# Patient Record
Sex: Female | Born: 1975 | Race: Black or African American | Hispanic: No | Marital: Single | Smoking: Never smoker
Health system: Southern US, Community
[De-identification: ages and names within clinical notes are randomized; demographics above are authoritative.]

## PROBLEM LIST (undated history)

## (undated) DIAGNOSIS — F419 Anxiety disorder, unspecified: Secondary | ICD-10-CM

## (undated) DIAGNOSIS — Z803 Family history of malignant neoplasm of breast: Secondary | ICD-10-CM

## (undated) DIAGNOSIS — G43109 Migraine with aura, not intractable, without status migrainosus: Secondary | ICD-10-CM

## (undated) DIAGNOSIS — F32A Depression, unspecified: Secondary | ICD-10-CM

## (undated) DIAGNOSIS — Z800 Family history of malignant neoplasm of digestive organs: Secondary | ICD-10-CM

## (undated) DIAGNOSIS — E669 Obesity, unspecified: Secondary | ICD-10-CM

## (undated) DIAGNOSIS — F329 Major depressive disorder, single episode, unspecified: Secondary | ICD-10-CM

## (undated) DIAGNOSIS — D649 Anemia, unspecified: Secondary | ICD-10-CM

## (undated) DIAGNOSIS — R51 Headache: Secondary | ICD-10-CM

## (undated) HISTORY — DX: Family history of malignant neoplasm of breast: Z80.3

## (undated) HISTORY — DX: Obesity, unspecified: E66.9

## (undated) HISTORY — DX: Family history of malignant neoplasm of digestive organs: Z80.0

## (undated) HISTORY — PX: WISDOM TOOTH EXTRACTION: SHX21

## (undated) HISTORY — PX: CHOLECYSTECTOMY: SHX55

## (undated) HISTORY — PX: DILATION AND CURETTAGE OF UTERUS: SHX78

---

## 1997-11-12 ENCOUNTER — Other Ambulatory Visit: Admission: RE | Admit: 1997-11-12 | Discharge: 1997-11-12 | Payer: Self-pay | Admitting: Obstetrics

## 1997-11-12 ENCOUNTER — Ambulatory Visit (HOSPITAL_COMMUNITY): Admission: RE | Admit: 1997-11-12 | Discharge: 1997-11-12 | Payer: Self-pay | Admitting: Obstetrics

## 1998-01-04 ENCOUNTER — Ambulatory Visit (HOSPITAL_COMMUNITY): Admission: RE | Admit: 1998-01-04 | Discharge: 1998-01-04 | Payer: Self-pay | Admitting: Obstetrics

## 1998-01-31 ENCOUNTER — Inpatient Hospital Stay (HOSPITAL_COMMUNITY): Admission: AD | Admit: 1998-01-31 | Discharge: 1998-01-31 | Payer: Self-pay | Admitting: *Deleted

## 1998-02-26 ENCOUNTER — Inpatient Hospital Stay (HOSPITAL_COMMUNITY): Admission: AD | Admit: 1998-02-26 | Discharge: 1998-02-26 | Payer: Self-pay | Admitting: Obstetrics

## 1998-02-27 ENCOUNTER — Ambulatory Visit (HOSPITAL_COMMUNITY): Admission: RE | Admit: 1998-02-27 | Discharge: 1998-02-27 | Payer: Self-pay | Admitting: Obstetrics

## 1998-03-06 ENCOUNTER — Inpatient Hospital Stay (HOSPITAL_COMMUNITY): Admission: AD | Admit: 1998-03-06 | Discharge: 1998-03-06 | Payer: Self-pay | Admitting: Obstetrics

## 1998-03-18 ENCOUNTER — Inpatient Hospital Stay (HOSPITAL_COMMUNITY): Admission: AD | Admit: 1998-03-18 | Discharge: 1998-03-18 | Payer: Self-pay | Admitting: Obstetrics

## 1998-03-28 ENCOUNTER — Emergency Department (HOSPITAL_COMMUNITY): Admission: EM | Admit: 1998-03-28 | Discharge: 1998-03-28 | Payer: Self-pay | Admitting: Emergency Medicine

## 1998-04-04 ENCOUNTER — Inpatient Hospital Stay (HOSPITAL_COMMUNITY): Admission: AD | Admit: 1998-04-04 | Discharge: 1998-04-06 | Payer: Self-pay | Admitting: Obstetrics

## 1998-06-05 ENCOUNTER — Inpatient Hospital Stay (HOSPITAL_COMMUNITY): Admission: EM | Admit: 1998-06-05 | Discharge: 1998-06-06 | Payer: Self-pay | Admitting: Emergency Medicine

## 1998-06-05 ENCOUNTER — Encounter: Payer: Self-pay | Admitting: Emergency Medicine

## 1998-06-29 ENCOUNTER — Encounter: Payer: Self-pay | Admitting: Emergency Medicine

## 1998-06-29 ENCOUNTER — Emergency Department (HOSPITAL_COMMUNITY): Admission: EM | Admit: 1998-06-29 | Discharge: 1998-06-29 | Payer: Self-pay | Admitting: Emergency Medicine

## 1998-06-30 ENCOUNTER — Emergency Department (HOSPITAL_COMMUNITY): Admission: EM | Admit: 1998-06-30 | Discharge: 1998-06-30 | Payer: Self-pay | Admitting: Emergency Medicine

## 1998-09-02 ENCOUNTER — Emergency Department (HOSPITAL_COMMUNITY): Admission: EM | Admit: 1998-09-02 | Discharge: 1998-09-02 | Payer: Self-pay | Admitting: Emergency Medicine

## 1998-10-21 ENCOUNTER — Emergency Department (HOSPITAL_COMMUNITY): Admission: EM | Admit: 1998-10-21 | Discharge: 1998-10-21 | Payer: Self-pay | Admitting: Emergency Medicine

## 1999-01-03 ENCOUNTER — Emergency Department (HOSPITAL_COMMUNITY): Admission: EM | Admit: 1999-01-03 | Discharge: 1999-01-03 | Payer: Self-pay | Admitting: Emergency Medicine

## 1999-03-20 ENCOUNTER — Encounter: Payer: Self-pay | Admitting: Emergency Medicine

## 1999-03-20 ENCOUNTER — Emergency Department (HOSPITAL_COMMUNITY): Admission: EM | Admit: 1999-03-20 | Discharge: 1999-03-20 | Payer: Self-pay | Admitting: Emergency Medicine

## 1999-05-13 ENCOUNTER — Other Ambulatory Visit: Admission: RE | Admit: 1999-05-13 | Discharge: 1999-05-13 | Payer: Self-pay | Admitting: Obstetrics

## 1999-05-19 ENCOUNTER — Emergency Department (HOSPITAL_COMMUNITY): Admission: EM | Admit: 1999-05-19 | Discharge: 1999-05-19 | Payer: Self-pay | Admitting: Emergency Medicine

## 1999-05-20 ENCOUNTER — Encounter: Payer: Self-pay | Admitting: Emergency Medicine

## 1999-09-05 ENCOUNTER — Ambulatory Visit (HOSPITAL_COMMUNITY): Admission: RE | Admit: 1999-09-05 | Discharge: 1999-09-05 | Payer: Self-pay | Admitting: Obstetrics

## 1999-09-05 ENCOUNTER — Encounter: Payer: Self-pay | Admitting: Obstetrics

## 1999-09-18 ENCOUNTER — Encounter: Payer: Self-pay | Admitting: Obstetrics

## 1999-09-18 ENCOUNTER — Encounter: Admission: RE | Admit: 1999-09-18 | Discharge: 1999-09-18 | Payer: Self-pay | Admitting: Obstetrics

## 1999-11-18 ENCOUNTER — Encounter: Payer: Self-pay | Admitting: Obstetrics

## 1999-11-18 ENCOUNTER — Encounter: Admission: RE | Admit: 1999-11-18 | Discharge: 1999-11-18 | Payer: Self-pay | Admitting: Obstetrics

## 2000-08-26 ENCOUNTER — Inpatient Hospital Stay (HOSPITAL_COMMUNITY): Admission: AD | Admit: 2000-08-26 | Discharge: 2000-08-26 | Payer: Self-pay | Admitting: Obstetrics

## 2000-08-30 ENCOUNTER — Emergency Department (HOSPITAL_COMMUNITY): Admission: EM | Admit: 2000-08-30 | Discharge: 2000-08-30 | Payer: Self-pay | Admitting: Emergency Medicine

## 2001-05-14 ENCOUNTER — Encounter: Payer: Self-pay | Admitting: Emergency Medicine

## 2001-05-14 ENCOUNTER — Emergency Department (HOSPITAL_COMMUNITY): Admission: EM | Admit: 2001-05-14 | Discharge: 2001-05-15 | Payer: Self-pay | Admitting: Emergency Medicine

## 2001-05-17 ENCOUNTER — Encounter: Payer: Self-pay | Admitting: Emergency Medicine

## 2001-05-17 ENCOUNTER — Emergency Department (HOSPITAL_COMMUNITY): Admission: EM | Admit: 2001-05-17 | Discharge: 2001-05-17 | Payer: Self-pay | Admitting: Emergency Medicine

## 2001-09-18 ENCOUNTER — Emergency Department (HOSPITAL_COMMUNITY): Admission: EM | Admit: 2001-09-18 | Discharge: 2001-09-19 | Payer: Self-pay | Admitting: Emergency Medicine

## 2002-01-14 ENCOUNTER — Inpatient Hospital Stay (HOSPITAL_COMMUNITY): Admission: AD | Admit: 2002-01-14 | Discharge: 2002-01-14 | Payer: Self-pay | Admitting: Obstetrics

## 2002-03-24 ENCOUNTER — Emergency Department (HOSPITAL_COMMUNITY): Admission: EM | Admit: 2002-03-24 | Discharge: 2002-03-24 | Payer: Self-pay | Admitting: Emergency Medicine

## 2002-05-09 ENCOUNTER — Emergency Department (HOSPITAL_COMMUNITY): Admission: EM | Admit: 2002-05-09 | Discharge: 2002-05-09 | Payer: Self-pay | Admitting: Emergency Medicine

## 2002-05-09 ENCOUNTER — Encounter: Payer: Self-pay | Admitting: Obstetrics

## 2002-05-12 ENCOUNTER — Encounter (INDEPENDENT_AMBULATORY_CARE_PROVIDER_SITE_OTHER): Payer: Self-pay

## 2002-05-12 ENCOUNTER — Ambulatory Visit (HOSPITAL_COMMUNITY): Admission: RE | Admit: 2002-05-12 | Discharge: 2002-05-12 | Payer: Self-pay | Admitting: Obstetrics

## 2002-08-08 ENCOUNTER — Observation Stay (HOSPITAL_COMMUNITY): Admission: AD | Admit: 2002-08-08 | Discharge: 2002-08-09 | Payer: Self-pay | Admitting: Obstetrics

## 2002-08-08 ENCOUNTER — Encounter: Payer: Self-pay | Admitting: Obstetrics

## 2002-10-22 ENCOUNTER — Inpatient Hospital Stay (HOSPITAL_COMMUNITY): Admission: AD | Admit: 2002-10-22 | Discharge: 2002-10-22 | Payer: Self-pay | Admitting: Obstetrics

## 2003-02-06 ENCOUNTER — Inpatient Hospital Stay (HOSPITAL_COMMUNITY): Admission: AD | Admit: 2003-02-06 | Discharge: 2003-02-06 | Payer: Self-pay | Admitting: Obstetrics

## 2003-03-21 ENCOUNTER — Inpatient Hospital Stay (HOSPITAL_COMMUNITY): Admission: AD | Admit: 2003-03-21 | Discharge: 2003-03-21 | Payer: Self-pay | Admitting: Obstetrics

## 2003-03-22 ENCOUNTER — Inpatient Hospital Stay (HOSPITAL_COMMUNITY): Admission: AD | Admit: 2003-03-22 | Discharge: 2003-03-22 | Payer: Self-pay | Admitting: Obstetrics

## 2003-04-02 ENCOUNTER — Inpatient Hospital Stay (HOSPITAL_COMMUNITY): Admission: AD | Admit: 2003-04-02 | Discharge: 2003-04-05 | Payer: Self-pay | Admitting: Obstetrics

## 2003-04-10 ENCOUNTER — Emergency Department (HOSPITAL_COMMUNITY): Admission: EM | Admit: 2003-04-10 | Discharge: 2003-04-10 | Payer: Self-pay | Admitting: Emergency Medicine

## 2003-07-14 ENCOUNTER — Emergency Department (HOSPITAL_COMMUNITY): Admission: EM | Admit: 2003-07-14 | Discharge: 2003-07-14 | Payer: Self-pay | Admitting: Emergency Medicine

## 2003-11-12 ENCOUNTER — Emergency Department (HOSPITAL_COMMUNITY): Admission: EM | Admit: 2003-11-12 | Discharge: 2003-11-13 | Payer: Self-pay | Admitting: Emergency Medicine

## 2004-03-20 ENCOUNTER — Emergency Department (HOSPITAL_COMMUNITY): Admission: EM | Admit: 2004-03-20 | Discharge: 2004-03-20 | Payer: Self-pay | Admitting: Emergency Medicine

## 2004-07-08 ENCOUNTER — Ambulatory Visit (HOSPITAL_COMMUNITY): Admission: RE | Admit: 2004-07-08 | Discharge: 2004-07-08 | Payer: Self-pay | Admitting: Internal Medicine

## 2004-07-08 ENCOUNTER — Encounter (INDEPENDENT_AMBULATORY_CARE_PROVIDER_SITE_OTHER): Payer: Self-pay | Admitting: Cardiology

## 2004-11-12 ENCOUNTER — Emergency Department (HOSPITAL_COMMUNITY): Admission: EM | Admit: 2004-11-12 | Discharge: 2004-11-12 | Payer: Self-pay | Admitting: Emergency Medicine

## 2005-03-20 ENCOUNTER — Emergency Department (HOSPITAL_COMMUNITY): Admission: EM | Admit: 2005-03-20 | Discharge: 2005-03-20 | Payer: Self-pay | Admitting: Emergency Medicine

## 2005-07-31 ENCOUNTER — Emergency Department (HOSPITAL_COMMUNITY): Admission: EM | Admit: 2005-07-31 | Discharge: 2005-07-31 | Payer: Self-pay | Admitting: Emergency Medicine

## 2006-03-22 ENCOUNTER — Emergency Department (HOSPITAL_COMMUNITY): Admission: EM | Admit: 2006-03-22 | Discharge: 2006-03-22 | Payer: Self-pay | Admitting: Emergency Medicine

## 2006-06-11 ENCOUNTER — Emergency Department (HOSPITAL_COMMUNITY): Admission: EM | Admit: 2006-06-11 | Discharge: 2006-06-11 | Payer: Self-pay | Admitting: Emergency Medicine

## 2007-02-03 ENCOUNTER — Emergency Department (HOSPITAL_COMMUNITY): Admission: EM | Admit: 2007-02-03 | Discharge: 2007-02-03 | Payer: Self-pay | Admitting: Emergency Medicine

## 2007-06-19 ENCOUNTER — Emergency Department (HOSPITAL_COMMUNITY): Admission: EM | Admit: 2007-06-19 | Discharge: 2007-06-19 | Payer: Self-pay | Admitting: Family Medicine

## 2007-07-05 ENCOUNTER — Inpatient Hospital Stay (HOSPITAL_COMMUNITY): Admission: AD | Admit: 2007-07-05 | Discharge: 2007-07-05 | Payer: Self-pay | Admitting: Obstetrics & Gynecology

## 2007-07-28 ENCOUNTER — Emergency Department (HOSPITAL_COMMUNITY): Admission: EM | Admit: 2007-07-28 | Discharge: 2007-07-28 | Payer: Self-pay | Admitting: Family Medicine

## 2008-04-07 ENCOUNTER — Inpatient Hospital Stay (HOSPITAL_COMMUNITY): Admission: AD | Admit: 2008-04-07 | Discharge: 2008-04-07 | Payer: Self-pay | Admitting: Obstetrics & Gynecology

## 2008-04-13 ENCOUNTER — Inpatient Hospital Stay (HOSPITAL_COMMUNITY): Admission: RE | Admit: 2008-04-13 | Discharge: 2008-04-13 | Payer: Self-pay | Admitting: Obstetrics & Gynecology

## 2008-05-10 ENCOUNTER — Inpatient Hospital Stay (HOSPITAL_COMMUNITY): Admission: AD | Admit: 2008-05-10 | Discharge: 2008-05-10 | Payer: Self-pay | Admitting: Obstetrics & Gynecology

## 2008-06-01 ENCOUNTER — Inpatient Hospital Stay (HOSPITAL_COMMUNITY): Admission: AD | Admit: 2008-06-01 | Discharge: 2008-06-01 | Payer: Self-pay | Admitting: Obstetrics and Gynecology

## 2008-06-15 ENCOUNTER — Emergency Department (HOSPITAL_COMMUNITY): Admission: EM | Admit: 2008-06-15 | Discharge: 2008-06-15 | Payer: Self-pay | Admitting: Emergency Medicine

## 2008-08-12 ENCOUNTER — Inpatient Hospital Stay (HOSPITAL_COMMUNITY): Admission: AD | Admit: 2008-08-12 | Discharge: 2008-08-12 | Payer: Self-pay | Admitting: Obstetrics and Gynecology

## 2008-12-08 ENCOUNTER — Inpatient Hospital Stay (HOSPITAL_COMMUNITY): Admission: AD | Admit: 2008-12-08 | Discharge: 2008-12-08 | Payer: Self-pay | Admitting: Obstetrics and Gynecology

## 2008-12-09 ENCOUNTER — Inpatient Hospital Stay (HOSPITAL_COMMUNITY): Admission: AD | Admit: 2008-12-09 | Discharge: 2008-12-12 | Payer: Self-pay | Admitting: Obstetrics and Gynecology

## 2009-02-05 ENCOUNTER — Emergency Department (HOSPITAL_COMMUNITY): Admission: EM | Admit: 2009-02-05 | Discharge: 2009-02-05 | Payer: Self-pay | Admitting: Family Medicine

## 2009-08-16 ENCOUNTER — Emergency Department (HOSPITAL_COMMUNITY): Admission: EM | Admit: 2009-08-16 | Discharge: 2009-08-16 | Payer: Self-pay | Admitting: Family Medicine

## 2010-01-20 ENCOUNTER — Inpatient Hospital Stay (HOSPITAL_COMMUNITY): Admission: AD | Admit: 2010-01-20 | Discharge: 2010-01-21 | Payer: Self-pay | Admitting: Obstetrics and Gynecology

## 2010-08-22 LAB — COMPREHENSIVE METABOLIC PANEL
ALT: 19 U/L (ref 0–35)
AST: 18 U/L (ref 0–37)
Albumin: 3.8 g/dL (ref 3.5–5.2)
Alkaline Phosphatase: 62 U/L (ref 39–117)
BUN: 6 mg/dL (ref 6–23)
CO2: 23 mEq/L (ref 19–32)
Calcium: 9.4 mg/dL (ref 8.4–10.5)
Chloride: 110 mEq/L (ref 96–112)
Creatinine, Ser: 0.71 mg/dL (ref 0.4–1.2)
GFR calc Af Amer: >60 mL/min (ref >60)
GFR calc non Af Amer: >60 mL/min (ref >60)
Glucose, Bld: 108 mg/dL — ABNORMAL HIGH (ref 70–99)
Potassium: 3.9 mEq/L (ref 3.5–5.1)
Sodium: 142 mEq/L (ref 135–145)
Total Bilirubin: 0.7 mg/dL (ref 0.3–1.2)
Total Protein: 7 g/dL (ref 6.0–8.3)

## 2010-08-22 LAB — WET PREP, GENITAL
Clue Cells Wet Prep HPF POC: NONE SEEN
Trich, Wet Prep: NONE SEEN
Yeast Wet Prep HPF POC: NONE SEEN

## 2010-08-22 LAB — URINALYSIS, ROUTINE W REFLEX MICROSCOPIC
Bilirubin Urine: NEGATIVE
Glucose, UA: NEGATIVE mg/dL
Ketones, ur: NEGATIVE mg/dL
Leukocytes, UA: NEGATIVE
Nitrite: NEGATIVE
Protein, ur: NEGATIVE mg/dL
Specific Gravity, Urine: 1.01 (ref 1.005–1.030)
Urobilinogen, UA: 0.2 mg/dL (ref 0.0–1.0)
pH: 7 (ref 5.0–8.0)

## 2010-08-22 LAB — CBC
HCT: 40.2 % (ref 36.0–46.0)
Hemoglobin: 13.4 g/dL (ref 12.0–15.0)
MCH: 32.1 pg (ref 26.0–34.0)
MCHC: 33.3 g/dL (ref 30.0–36.0)
MCV: 96.5 fL (ref 78.0–100.0)
Platelets: 209 10*3/uL (ref 150–400)
RBC: 4.16 MIL/uL (ref 3.87–5.11)
RDW: 13.3 % (ref 11.5–15.5)
WBC: 5.4 10*3/uL (ref 4.0–10.5)

## 2010-08-22 LAB — URINE MICROSCOPIC-ADD ON: WBC, UA: NONE SEEN WBC/hpf (ref <3)

## 2010-08-22 LAB — PREGNANCY, URINE: Preg Test, Ur: NEGATIVE

## 2010-08-22 LAB — GC/CHLAMYDIA PROBE AMP, GENITAL
Chlamydia, DNA Probe: NEGATIVE
GC Probe Amp, Genital: NEGATIVE

## 2010-09-15 LAB — CBC
HCT: 28.5 % — ABNORMAL LOW (ref 36.0–46.0)
HCT: 34.7 % — ABNORMAL LOW (ref 36.0–46.0)
Hemoglobin: 11.8 g/dL — ABNORMAL LOW (ref 12.0–15.0)
Hemoglobin: 9.7 g/dL — ABNORMAL LOW (ref 12.0–15.0)
MCHC: 34 g/dL (ref 30.0–36.0)
MCHC: 34.3 g/dL (ref 30.0–36.0)
MCV: 93.2 fL (ref 78.0–100.0)
MCV: 93.9 fL (ref 78.0–100.0)
Platelets: 147 10*3/uL — ABNORMAL LOW (ref 150–400)
Platelets: 186 10*3/uL (ref 150–400)
RBC: 3.03 MIL/uL — ABNORMAL LOW (ref 3.87–5.11)
RBC: 3.72 MIL/uL — ABNORMAL LOW (ref 3.87–5.11)
RDW: 13.7 % (ref 11.5–15.5)
RDW: 13.9 % (ref 11.5–15.5)
WBC: 4.9 10*3/uL (ref 4.0–10.5)
WBC: 8.2 10*3/uL (ref 4.0–10.5)

## 2010-09-15 LAB — RPR: RPR Ser Ql: NONREACTIVE

## 2010-09-18 LAB — CBC
HCT: 37.5 % (ref 36.0–46.0)
Hemoglobin: 12.5 g/dL (ref 12.0–15.0)
MCHC: 33.3 g/dL (ref 30.0–36.0)
MCV: 98.5 fL (ref 78.0–100.0)
Platelets: 179 10*3/uL (ref 150–400)
RBC: 3.81 MIL/uL — ABNORMAL LOW (ref 3.87–5.11)
RDW: 13.5 % (ref 11.5–15.5)
WBC: 6 10*3/uL (ref 4.0–10.5)

## 2010-10-12 ENCOUNTER — Emergency Department (HOSPITAL_COMMUNITY)
Admission: EM | Admit: 2010-10-12 | Discharge: 2010-10-12 | Disposition: A | Discharge status: Home / Self Care | Payer: Self-pay | Attending: Emergency Medicine | Admitting: Emergency Medicine

## 2010-10-12 DIAGNOSIS — G43109 Migraine with aura, not intractable, without status migrainosus: Secondary | ICD-10-CM | POA: Insufficient documentation

## 2010-10-21 NOTE — Discharge Summary (Signed)
NAME:  Shirley Ponce, Shirley Ponce NO.:  [id number]   MEDICAL RECORD NO.:  [id number]          PATIENT TYPE:  INP   LOCATION:  9101                          FACILITY:  [facility]   PHYSICIAN:  Crist Fat. Rivard, M.D. DATE OF BIRTH:  1976-03-22   DATE OF ADMISSION:  12/09/2008  DATE OF DISCHARGE:  12/12/2008                               DISCHARGE SUMMARY   HISTORY:  Shirley Ponce is a 35 year old gravida 6, now para 3-0-3-3, she is  being discharged following the cesarean birth of her son, Madelin Rear, who  was born on December 09, 2006, by primary cesarean at 39.5 weeks' gestation  due to a face presentation, his weight was 8 pounds 7 ounces at  delivery, and he had Apgar scores of 9 at 1 minute and 10 at 5 minutes.   ADMISSION DIAGNOSES:  Intrauterine pregnancy at 39.5 weeks' gestation,  face presentation, increased BMI, positive GBS.   DISCHARGE DIAGNOSIS:  Stable.  Postop day #3 of term primary cesarean,  increased BMI, positive GBS, treated, anemia.   COURSE OF STAY:  Shirley Ponce was admitted on December 09, 2008 at 7 cm dilation  early in the morning and GBS prophylaxis was begun with ampicillin and  epidural was ordered.  A few hours later, Dr. Estanislado Pandy was doing a vaginal  examination and there was a spontaneous rupture of membranes during exam  and face presentation was diagnosed.  They decided to wait and try some  Pitocin and position changes to see if presentation resolve.  The baby  started having variable decelerations with heart rate better in the  afternoon and the face began to be edematous.  Then decision was made to  proceed with the cesarean delivery, baby boy Madelin Rear was born at 40 on  December 09, 2008, weight 8 pounds 7 ounces, had Apgars of 9 and 10 and was  very vigorous.  Shirley Ponce has progressed well in her recovery following  the cesarean clinical pathway of care.  She is having some dizziness.  However, due to her anemia and some normal incisional pain.   PERTINENT LABS:  Ms.  Shirley Ponce is O positive, rubella immune, and had a  repeat RPR that was nonreactive at delivery.  Her white blood cell count  day one postop with 8.2 (4.9), hemoglobin 9.7 (11.8), hematocrit 28.5  (34.7), platelet count 147,000 (186).   Physical assessment on the day of discharge which is postop day #3, the  patient did complaint of incisional pain.  She does have a low dizziness  when getting up.  She did complain of a golf-ball sized blood clot.  Upon waking up this morning and going to the bathroom, she is reporting  positive flatus and breast-feeding was going well.  She planned Mirena  for birth control.  She declines an interim birth control method.  There  was some discussion of her history of depression during the pregnancy  and history of postpartum depression and the patient declines Zoloft at  this time or counseling.  She said she feels stable and agrees to call  us  immediately should she start to get in trouble with depression and  anxiety symptoms.   PHYSICAL EXAMINATION:  VITAL SIGNS:  Today her temperature 98.4, pulse  93, respiratory rate 18, and blood pressure 116/78.  LUNGS:  Clear to auscultation bilaterally.  HEART:  Regular rate and rhythm.  No murmurs.  BREASTS:  Soft.  Nipples intact.  ABDOMEN:  Soft, minimal distention.  Fundus firm at the umbilicus.  GU:  Incision and Steri-Strips clean, dry, and intact.  Light lochia  from vagina.  EXTREMITIES:  Trace edema x4 extremities.  DTRs +1 and +1.  Negative  Homans sign x2.  PSYCHIATRIC:  Stable.  Depression evaluation per social worker consult  today.  Father of the baby present and attentive at bed side and  supportive.   IMPRESSION:  A 35 year old gravida 6, para 3-0-3-3 at 3 days post  cesarean, anemia with minimal dizziness, history of depression with  medication management, planned discharge today.   DISCHARGE MEDICATIONS:  1. Protonix 40 mg, continue as before.  2. Vistaril 25 mg p.r.n. q.6 h., continue as  before.  3. Flexeril 10 mg every 8 hours, continue as before.  4. Prenatal vitamins daily, continue as before.   NEW PRESCRIPTIONS:  1. Integra F iron tablet take daily with food and vitamin C, no dairy.  2. Motrin 600 mg every q.6 h.  3. Percocet 1-2 every 4-6 hours p.r.n. severe pain.  4. Colace as needed.   The patient was provided with and reviewed the postpartum discharge  booklet including postpartum and postoperative danger signs, special  emphasis was made cautioning the patient concerning her fertility,  depression, anxiety, and constipation prevention.  The patient was also  provided with a Mirena brochure.  She agrees to abstinence until Mirena  is placed and discharged in stable condition with a followup appointment  planned for 5-6 weeks or sooner p.r.n. problems.  Shirley Ponce is deemed to  have received full benefit for the delivery of her son, Madelin Rear.      Janna Melsness, CNM      Sandra A. Rivard, M.D.  Electronically Signed    JM/MEDQ  D:  12/12/2008  T:  12/13/2008  Job:  [id number]

## 2010-10-21 NOTE — H&P (Signed)
NAME:  Shirley Shirley Ponce, Shirley Ponce NO.:  [id number]   MEDICAL RECORD NO.:  [id number]          PATIENT TYPE:  INP   LOCATION:  9165                          [facility]ILITY:  [facility]   PHYSICIAN:  Shirley Shirley Ponce Shirley Ponce, M.D. DATE OF BIRTH:  January 29, 1976   DATE OF ADMISSION:  12/09/2008  DATE OF DISCHARGE:                              HISTORY & PHYSICAL   [name] is a 35 year old gravida 6, para  2-0-3-2, at 39-5/7 weeks who  presents in active labor.  The patient reports increased uterine  contractions since about 5 a.m. this morning.  She was seen in maternity  admissions unit for prolonged monitoring and therapeutic sedation on  December 08, 2008.  At that time her cervix was fingertip to 1 initially and  then after several hours of observation.  She was sent home with Vicodin  for pain and to await increased contractions.  She denies any leaking or  bleeding and reports positive fetal movement.   Pregnancy has been remarkable for:  1. Positive group B strep.  2. History of 2 TABs and 1 SAB.  3. History of depression and postpartum depression.  No medications      required  4. History of Chlamydia in the past.  5. History of migraines.  6. History of polyhydramnios, that resolved by approximately 34 weeks.   PRENATAL LABS:  Blood type is O positive, Rh antibody negative.  VDRL  nonreactive, rubella titer positive, hepatitis B surface antigen  negative, HIV is nonreactive.  Sickle cell test was negative.  GC and  Chlamydia cultures were negative in December.  She had a first trimester  screen that was normal.  AFP was normal.  Cystic fibrosis testing was  declined.  She had GC and Chlamydia cultures done again at 24 weeks,  secondary to some spotting.  Beta strep was also done at that time and  was noted to be positive.  Hemoglobin upon entering the practice was  12.7, it was 12.1 at 28 weeks.  RPR was nonreactive.  Glucola was  negative.  The patient had a fetal fibronectin done at 32  weeks that was  negative.   HISTORY OF PRESENT PREGNANCY:  The patient entered care at approximately  11 weeks.  She had some left lower quadrant pain and some spotting in  early pregnancy.  She had an ultrasound done in the first trimester  giving an [facility] of  December 08, 2008, which was in agreement with normal LMP  dating of December 11, 2008.  She had a first trimester screen that was  normal.  She was having some issues with depression early in pregnancy.  She was given an appointment at the [facility] and had Zoloft.  She  was not taking that as the pregnancy progressed.  She had an ultrasound  for anatomy at 20 weeks that showed normal growth and development.  She  was having some issues with migraines.  She was prescribed Flexeril.  She had some more emotional upsets at around 16 weeks but again declined  referral.  She had been  on Zoloft with a previous postpartum experience  but did not continue those medications.  Her depression began to get  better by 22 weeks.  [facility].  No significant regimen changes were  identified.  At 24 weeks, she had some spotting.  She was evaluated by  Dr. Stefano Gaul.  The cervix was closed and long at that time.  She was  offered observation in the hospital which she declined.  She had an  ultrasound that showed normal cervical length and placental status.  Positive group B strep was noted at that time.  Glucola was normal at 28  weeks.  Hemoglobin was normal.  RPR was nonreactive.  At 31 weeks, she  had a Chem-9 that showed significant leukocytes. Macrobid was given.  Fetal fibronectin was done at 32 weeks and was negative.  She did have  some significant heartburn and was placed on Protonix.  She had another  fetal fibronectin on Oct 16, 2008, which was negative.  She had an  ultrasound on Oct 17, 2008, showing normal cervical length.  Amniotic  fluid index was elevated at the 97 percentile  with an AFI of 29.2 and  BPP was 8/8.  Cervical length was normal.  She had a repeat ultrasound  at 34 weeks with resolution of the polyhydramnios to an AFI of the 70th  percentile with an amniotic fluid index of 19.65.  BPP was 8/8.  At 35  weeks, AFI was in the 85th percentile.  The patient reports growth was  reported to be normal.  Rest her pregnancy was essentially  uncomplicated.   OBSTETRICAL HISTORY:  In 1999 she had a vaginal birth of a female  infant, weight 6 pounds 12 ounces, at 40 weeks.  She was in labor 15  hours.  She had epidural anesthesia.  She was induced.  She had TAB in  2000 and 2001 and spontaneous miscarriage in 2003 and in 2004 she had a  vaginal birth of a female infant weight 8 pounds 5 ounces at 40 weeks.  She was in labor 24 hours.  She had epidural anesthesia.  She was  induced with that pregnancy as well.  She had hyperemesis with her  second child and had postpartum depression after her second child but  did not take any medications.   MEDICAL HISTORY:  She was an Ortho Tri-Cyclen low user prior to  pregnancy but stopped it significantly before the pregnancy occurred.  She has a history of Chlamydia in the past.  She reports usual childhood  illnesses.  She had to wear oxygen during her previous epidural but had  no significant difficulty with that.   SENSITIVITIES:  Mildly to the latex.   FAMILY HISTORY:  Maternal grandfather had heart disease.  Her mother,  sister, maternal aunt, maternal uncle have chronic hypertension.  Her  mother has lupus.  Maternal first cousin has diabetes.  Maternal  grandmother had a stroke.  Mother also is a breast cancer survivor,  maternal grandmother had breast cancer.  Father had lung cancer is now  deceased.  Paternal grandmother had breast cancer.  Maternal uncle  smokes.  Her dad was also a smoker.   SURGICAL HISTORY:  The patient had cholecystectomy in 1999.  She had  probing of her tear ducts at age 15.  She has  had 2 TABs.   GENETIC HISTORY:  Is remarkable maternal cousin having an extra digit  and maternal cousin with sickle cell trait.   SOCIAL HISTORY:  Her father passed away in 2009/05/06due to stomach  cancer. Patient is single.  Father of baby is involved and supportive.  His name is [facility].  The patient is Tree surgeon.  She  denies religious affiliation.  She is in college at present, full-time.  Her partner has also had some college.  He is employed at [employer]  She has been followed by the physician service at  [facility].  She denies any alcohol, drug or tobacco use during  this pregnancy.   PHYSICAL EXAMINATION:  VITAL SIGNS:  Stable.  The patient is febrile.  HEENT: Within normal limits.  LUNGS:  Breath sounds are clear.  HEART:  Regular rate and rhythm without murmur.  BREASTS:  Soft and nontender.  ABDOMEN:  Fundal height is approximately 39 cm, estimated fetal weight  is 7-1/2 to 8 pounds.  Uterine contractions are 3 minutes apart,  moderate to strong quality.  Cervix is 7 cm, 100%, vertex at -2 station  with intact bag of water.  Fetal heart rate is reassuring at present  with negative spontaneous CST.  EXTREMITIES:  Deep tendon reflexes are 2+ without clonus.  There is  trace edema noted.   IMPRESSION:  1. Intrauterine pregnancy at 39-5/7 weeks.  2. Active labor.  3. Positive group B strep.   PLAN:  1. Admit to birthing suite per with consult to Dr. Silverio Lay as      attending physician.  2. Routine physician orders.  3. Plan group B strep prophylaxis with the dose of ampicillin now.  4. The patient desires epidural but she understands that labor      progress may preclude this from occurring.  Will give IV pain      medication of the patient desires.      Renaldo Reel Emilee Hero, C.N.M.      Shirley Fat Shirley Ponce, M.D.  Electronically Signed    VLL/MEDQ  D:  12/09/2008  T:  12/09/2008  Job:  [id number]

## 2010-10-21 NOTE — Op Note (Signed)
NAME:  Shirley Ponce, Shirley Ponce NO.:  [id number]   MEDICAL RECORD NO.:  [id number]          PATIENT TYPE:  INP   LOCATION:  9148                          [facility]ILITY:  [facility]   PHYSICIAN:  Crist Fat. Rivard, M.D. DATE OF BIRTH:  [birth year]   DATE OF PROCEDURE:  12/09/2008  DATE OF DISCHARGE:                               OPERATIVE REPORT   PREOPERATIVE DIAGNOSIS:  Intrauterine pregnancy at 40 weeks face  presentation, failure to progress.   POSTOPERATIVE DIAGNOSIS:  Intrauterine pregnancy at 40 weeks face  presentation, failure to progress.   ANESTHESIA:  Epidural, Dr. Antony Madura.   PROCEDURE:  Primary low transverse cesarean section.   SURGEON:  Crist Fat. Rivard, MD   ASSISTANT:  Renaldo Reel. Emilee Hero, CNM   ESTIMATED BLOOD LOSS:  800 mL.   DESCRIPTION OF PROCEDURE:  After being informed of the planned procedure  with possible complications including bleeding, infection, injury to  bladder, bowels, or ureter, informed consent was obtained.  The patient  was taken to OR #2 and preexisting epidural anesthesia was reinforced.  She was placed in the dorsal decubitus position, pelvis tilted to the  left, prepped and draped in a sterile fashion and a Foley catheter was  already in her bladder.  After assessing adequate level of anesthesia,  we infiltrated the suprapubic area with Marcaine 0.25 20 mL and  performed a Pfannenstiel incision which was brought down sharply to the  fascia.  The fascia was incised in a low transverse fashion.  Linea alba  was dissected.  Peritoneum was entered in the midline fashion.  A Alexis  retractor was placed easily and the visceral peritoneum was entered in a  low transverse fashion, allowing [place] to safely retract bladder by  developing a bladder flap.  Myometrium was then entered in a low  transverse fashion first with knife and stented bluntly.  We assist the  birth of a female infant in the face presentation posterior chin at 17:17.  A nuchal cord was  reduced, baby was then delivered.  Cord was clamped  with 2 Kelly clamps and mouth and nose were suctioned.  Baby was then  given to Dr. Joana Reamer, neonatologist present in the room.  A 10 mL of  blood was drawn from the umbilical vein and the placenta was delivered  spontaneously.  It was complete and cord had 3 vessels.  Uterine  revision was negative.  Myometrium was then closed in 2 layers, first  with a running lock suture of 0 Vicryl, then with a Lembert suture of 0  Vicryl imbricating the first one.  A figure-of-eight stitch of 0 Vicryl  midline control hemostasis.  Peritoneal edges were cauterized for  further hemostasis.  Both tubes and both ovaries were assessed and  normal.  Both paracolic gutters were cleaned.  The pelvis was profusely  irrigated with warm saline to confirm a satisfactory hemostasis.  Retractors and sponges were removed under fascia.  Hemostasis was  completed with cautery and the fascia was closed with 2 running sutures  of 1 Vicryl meeting in the midline.  Wound was irrigated with warm  saline.  Hemostasis was completed with cautery and the skin was closed  with a subcuticular suture of 3-0 Monocryl and Steri-Strips.   Instrument and sponge count was complete x2.  Estimated blood loss was  800 mL.  The procedure was well tolerated by the patient, who was taken  to recovery room in a well and stable condition.   Little boy named, Dillan J, was born at 17:17, weight 8 pounds 7 ounces,  and received an Apgar of 9 at 1-minute and 10 at 5 minutes.   SPECIMEN:  Placenta and cord sent to Labor and Delivery.      Crist Fat Rivard, M.D.  Electronically Signed     SAR/MEDQ  D:  12/09/2008  T:  12/10/2008  Job:  [id number]

## 2010-10-24 NOTE — Op Note (Signed)
   NAME:  Shirley Ponce, Shirley Ponce NO.:  [id number]   MEDICAL RECORD NO.:  [id number]                   PATIENT TYPE:  AMB   LOCATION:  SDC                                  [facility]ILITY:  [facility]   PHYSICIAN:  Kathreen Cosier, M.D.           DATE OF BIRTH:  Sep 22, 1975   DATE OF PROCEDURE:  05/12/2002  DATE OF DISCHARGE:                                 OPERATIVE REPORT   PREOPERATIVE DIAGNOSIS:  Spontaneous incomplete abortion.   PROCEDURE:  Dilatation and evacuation.   DESCRIPTION OF PROCEDURE:  Using MAC, patient in lithotomy position,  perineum and vagina were prepped and draped, bladder emptied with straight  catheter.  Bimanual exam revealed the uterus to be six to eight weeks' size.  A weighted speculum placed in the vagina.  Cervix injected at 3, 9, and 12  o'clock with a total of 8 cc of 1% Xylocaine.  Anterior lip of the cervix  grasped with tenaculum.  The cervical os was noted to be open, easily  admitted a 20 Pratt.  A #9 suction was used to aspirate the uterine contents  and a moderate amount of tissue obtained and the cavity clean at the end of  the procedure.  The patient tolerated the procedure well, taken to the  recovery room in good condition.                                               Kathreen Cosier, M.D.    BAM/MEDQ  D:  05/12/2002  T:  05/12/2002  Job:  [id number]

## 2011-02-27 LAB — GC/CHLAMYDIA PROBE AMP, GENITAL
Chlamydia, DNA Probe: NEGATIVE
GC Probe Amp, Genital: NEGATIVE

## 2011-02-27 LAB — WET PREP, GENITAL
Clue Cells Wet Prep HPF POC: NONE SEEN
Yeast Wet Prep HPF POC: NONE SEEN

## 2011-02-27 LAB — POCT PREGNANCY, URINE
Operator id: 220991
Preg Test, Ur: NEGATIVE

## 2011-03-10 LAB — URINALYSIS, ROUTINE W REFLEX MICROSCOPIC
Bilirubin Urine: NEGATIVE
Ketones, ur: NEGATIVE
Nitrite: NEGATIVE
Protein, ur: NEGATIVE

## 2011-03-10 LAB — GC/CHLAMYDIA PROBE AMP, GENITAL
Chlamydia, DNA Probe: NEGATIVE
GC Probe Amp, Genital: NEGATIVE

## 2011-03-10 LAB — WET PREP, GENITAL
Clue Cells Wet Prep HPF POC: NONE SEEN
Trich, Wet Prep: NONE SEEN
Yeast Wet Prep HPF POC: NONE SEEN

## 2011-03-10 LAB — CBC
HCT: 39
Hemoglobin: 12.7
MCV: 96.2
Platelets: 234
RDW: 13.8

## 2011-03-10 LAB — POCT PREGNANCY, URINE: Preg Test, Ur: POSITIVE

## 2011-03-13 LAB — URINE CULTURE: Colony Count: >=100000

## 2011-03-13 LAB — URINALYSIS, ROUTINE W REFLEX MICROSCOPIC
Bilirubin Urine: NEGATIVE
Glucose, UA: NEGATIVE mg/dL
Hgb urine dipstick: NEGATIVE
Ketones, ur: 15 mg/dL — AB
Ketones, ur: NEGATIVE mg/dL
Nitrite: NEGATIVE
Protein, ur: NEGATIVE mg/dL
Specific Gravity, Urine: 1.015 (ref 1.005–1.030)
Urobilinogen, UA: 0.2 mg/dL (ref 0.0–1.0)
Urobilinogen, UA: 0.2 mg/dL (ref 0.0–1.0)
pH: 6.5 (ref 5.0–8.0)

## 2011-03-13 LAB — WET PREP, GENITAL
Clue Cells Wet Prep HPF POC: NONE SEEN
Trich, Wet Prep: NONE SEEN
Yeast Wet Prep HPF POC: NONE SEEN

## 2011-03-13 LAB — URINE MICROSCOPIC-ADD ON

## 2011-10-09 ENCOUNTER — Encounter: Payer: Self-pay | Admitting: Obstetrics & Gynecology

## 2011-11-05 ENCOUNTER — Encounter (HOSPITAL_COMMUNITY): Payer: Self-pay | Admitting: *Deleted

## 2011-11-05 ENCOUNTER — Inpatient Hospital Stay (HOSPITAL_COMMUNITY)
Admission: AD | Admit: 2011-11-05 | Discharge: 2011-11-05 | Disposition: A | Discharge status: Home / Self Care | Payer: Self-pay | Source: Ambulatory Visit | Attending: Obstetrics & Gynecology | Admitting: Obstetrics & Gynecology

## 2011-11-05 DIAGNOSIS — G43909 Migraine, unspecified, not intractable, without status migrainosus: Secondary | ICD-10-CM | POA: Insufficient documentation

## 2011-11-05 DIAGNOSIS — R109 Unspecified abdominal pain: Secondary | ICD-10-CM | POA: Insufficient documentation

## 2011-11-05 HISTORY — DX: Depression, unspecified: F32.A

## 2011-11-05 HISTORY — DX: Anemia, unspecified: D64.9

## 2011-11-05 HISTORY — DX: Major depressive disorder, single episode, unspecified: F32.9

## 2011-11-05 HISTORY — DX: Headache: R51

## 2011-11-05 HISTORY — DX: Migraine with aura, not intractable, without status migrainosus: G43.109

## 2011-11-05 LAB — URINALYSIS, ROUTINE W REFLEX MICROSCOPIC
Bilirubin Urine: NEGATIVE
Glucose, UA: NEGATIVE mg/dL
Hgb urine dipstick: NEGATIVE
Ketones, ur: NEGATIVE mg/dL
Leukocytes, UA: NEGATIVE
Protein, ur: NEGATIVE mg/dL
pH: 6.5 (ref 5.0–8.0)

## 2011-11-05 MED ORDER — DEXAMETHASONE SODIUM PHOSPHATE 4 MG/ML IJ SOLN
1.0000 mg | INTRAMUSCULAR | Status: AC
  Administered 2011-11-05: 1 mg via INTRAVENOUS
  Filled 2011-11-05: qty 0.25

## 2011-11-05 MED ORDER — KETOROLAC TROMETHAMINE 30 MG/ML IJ SOLN
30.0000 mg | INTRAMUSCULAR | Status: AC
  Administered 2011-11-05: 30 mg via INTRAVENOUS
  Filled 2011-11-05: qty 1

## 2011-11-05 MED ORDER — METOCLOPRAMIDE HCL 5 MG/ML IJ SOLN
10.0000 mg | INTRAMUSCULAR | Status: AC
  Administered 2011-11-05: 10 mg via INTRAVENOUS
  Filled 2011-11-05: qty 2

## 2011-11-05 MED ORDER — HYDROMORPHONE HCL PF 1 MG/ML IJ SOLN
1.0000 mg | INTRAMUSCULAR | Status: AC
  Administered 2011-11-05: 1 mg via INTRAVENOUS
  Filled 2011-11-05: qty 1

## 2011-11-05 MED ORDER — DIPHENHYDRAMINE HCL 50 MG/ML IJ SOLN
25.0000 mg | INTRAMUSCULAR | Status: AC
  Administered 2011-11-05: 25 mg via INTRAVENOUS
  Filled 2011-11-05: qty 1

## 2011-11-05 MED ORDER — DEXTROSE 5 % IN LACTATED RINGERS IV BOLUS
1000.0000 mL | INTRAVENOUS | Status: AC
  Administered 2011-11-05: 1000 mL via INTRAVENOUS

## 2011-11-05 NOTE — MAU Provider Note (Signed)
History     CSN: [id number]  Arrival date and time: 11/05/11 1825   None     Chief Complaint  Patient presents with  . Headache  . Abdominal Pain  . Nausea  . Emesis   HPI  This is a 36 year old female with a history of migraine headache who presents with her typical migraine headache, although the intensity is worse, since Tuesday.  Initially sensitive to smell, then photophobia and sensitive to noise.  Headache onset was later and gradual.    Nausea started after headache Tuesday evening.  Tried excedrin migraine this morning without relief.  Usually takes Imitrex but she is currently uninsured and is out.  The course is constant.  No relieving factors.  Currently, the migraine is worst on the right side of the head in the temporal region.  It is 10/10 and disabling.  Exacerbated by light, sound, smell.  Associated with nausea and vomiting.  Denies sensory or motor changes.  Denies vision changes.  Denies stiff neck.  The patient has a history of prior ED presentation for the same symptoms.  Her second complaint is abdominal pain, which she has had for months.  It comes and goes.  When it is gone, it is sometimes completely gone.  When it is present, it is cramping, sometimes associated with menstrual bleeding and sometimes not.  Localized to bilateral lower abdomen.  Warm compresses help ease pain.  Has tried 800 mg Ibuprofen with some relief, but limited by nausea.  Standing straight up makes it worse.  Usually not associated with nausea or vomiting.  Is currently experiencing 9/10 abdominal pain, cramping, localized to lower abdomen, without radiation.  Pertinent Gynecological History: Menses: irregular occurring approximately every 15-16 days without intermenstrual spotting Bleeding: intermenstrual bleeding Contraception: IUD (Mirena) DES exposure: unknown Blood transfusions: none Sexually transmitted diseases: past history: Chlamydia in 2004, treated with negative test of cure.   [place] 2008 treated with negative test of cure.  Tested for HIV in 2011 and negative.  Has sex with men only, only 1 sexual partner in the last 5 years and 4 lifetime sexual partners.  Believes partners are monogamous.  Current partner has never been in prison or had sex with another man. Previous GYN Procedures: D&C for miscarriage in 2003.  2 abortions in 2000 and 2001  Last mammogram: Never had  Last pap: normal Date: 2011   Past Medical History  Diagnosis Date  . Headache   . Anemia   . Depression   . Migraine headache with aura     Past Surgical History  Procedure Date  . Cholecystectomy   . Wisdom tooth extraction   . Dilation and curettage of uterus     Family History  Problem Relation Age of Onset  . Cancer Maternal Grandmother   . Cancer Maternal Grandfather   . Cancer Paternal Grandmother   . Cancer Paternal Grandfather     History  Substance Use Topics  . Smoking status: Never Smoker   . Smokeless tobacco: Not on file  . Alcohol Use: No    Allergies:  Allergies  Allergen Reactions  . Latex Hives    Prescriptions prior to admission  Medication Sig Dispense Refill  . aspirin-acetaminophen-caffeine (EXCEDRIN MIGRAINE) 250-250-65 MG per tablet Take 1 tablet by mouth every 6 (six) hours as needed. migrain        Review of Systems  Constitutional: Negative for fever, chills, weight loss and diaphoresis.  HENT: Negative for hearing loss, ear  pain, nosebleeds, congestion, sore throat, tinnitus and ear discharge.   Eyes: Positive for photophobia. Negative for blurred vision, double vision, pain, discharge and redness.  Respiratory: Negative for cough, hemoptysis, shortness of breath and wheezing.   Cardiovascular: Negative for chest pain and palpitations.  Gastrointestinal: Positive for nausea, vomiting (Threw up earlier this morning) and abdominal pain (See HPI). Negative for heartburn, diarrhea, constipation, blood in stool and melena.  Genitourinary:  Negative for dysuria, frequency and flank pain.  Musculoskeletal: Negative for myalgias, joint pain and falls.  Skin: Negative for itching and rash.  Neurological: Positive for headaches (See HPI). Negative for dizziness, sensory change, focal weakness, seizures and loss of consciousness.  Endo/Heme/Allergies: Negative for polydipsia. Does not bruise/bleed easily.  Psychiatric/Behavioral: Negative for depression and hallucinations. The patient is not nervous/anxious and does not have insomnia.    Physical Exam   Blood pressure 154/99, pulse 90, temperature 98.3 F (36.8 C), temperature source Oral, resp. rate 20, height 5\' 5"  (1.651 m), weight 112.038 kg (247 lb), last menstrual period 09/21/2011, SpO2 100.00%.  Physical Exam  Constitutional: She is oriented to person, place, and time. She appears well-developed and well-nourished. No distress.  HENT:  Head: Normocephalic and atraumatic.  Eyes: Conjunctivae are normal. Pupils are equal, round, and reactive to light. Right eye exhibits no discharge. Left eye exhibits no discharge. No scleral icterus.  Neck: No tracheal deviation present.  Cardiovascular: Normal rate and regular rhythm.   Respiratory: Effort normal and breath sounds normal. No stridor.  GI: Soft. She exhibits no distension and no mass. There is no tenderness. There is no rebound and no guarding.  Neurological: She is alert and oriented to person, place, and time. No cranial nerve deficit.       No motor defects  Skin: Skin is warm and dry.  Psychiatric:       Affect pained, mood in pain.  Behavior normal.  Thought content normal.  Judgement normal.    MAU Course  Procedures  None  Assessment and Plan  This is a 36 year old nonpregnant patient presenting with migraine and chronic abdominal pain currently in exacerbation but with a benign abdomen and without concerning features for intraabdominal urgency.  She has an appointment in gyn clinic in June.  She received  benadryl 25 mg IV, Reglan 10 mg IV, Decadron 1 mg IV, Toradol 30 mg IV, and dilaudid 1 mg IV, as well as 1L D5LR.  Both her abdominal pain and headache improved significantly and she felt ready to go home at time of discharge.  Clancy Gourd 11/05/2011, 8:38 PM   Agree. Sanjuana Mruk 11/06/2011 3:21 AM

## 2011-11-05 NOTE — Discharge Instructions (Signed)
Migraine Headache  A migraine is very bad pain on one or both sides of your head. The cause of a migraine is not always known. A migraine can be triggered or caused by different things, such as:   Alcohol.   Smoking.   Stress.   Periods (menstruation) in women.   Aged cheeses.   Foods or drinks that contain nitrates, glutamate, aspartame, or tyramine.   Lack of sleep.   Chocolate.   Caffeine.   Hunger.   Medicines, such as nitroglycerine (used to treat chest pain), birth control pills, estrogen, and some blood pressure medicines.  HOME CARE   Many medicines can help migraine pain or keep migraines from coming back. Your doctor can help you decide on a medicine or treatment program.   If you or your child gets a migraine, it may help to lie down in a dark, quiet room.   Keep a headache journal. This may help find out what is causing the headaches. For example, write down:   What you eat and drink.   How much sleep you get.   Any change to your diet or medicines.  GET HELP RIGHT AWAY IF:    The medicine does not work.   The pain begins again.   The neck is stiff.   You have trouble seeing.   The muscles are weak or you lose muscle control.   You have new symptoms.   You lose your balance.   You have trouble walking.   You feel faint or pass out.  MAKE SURE YOU:    Understand these instructions.   Will watch this condition.   Will get help right away if you are not doing well or get worse.  Document Released: 03/03/2008 Document Revised: 05/14/2011 Document Reviewed: 01/28/2009  [employer] Patient Information 2012 [employer].

## 2011-11-05 NOTE — [facility] Note (Signed)
C/o migraine headache since Tuesday- has tried Ibuprofen with no relief; also c/o IUD discomfort and wishes to have placement check- states that she has had problems on & off since IUD was place August 2010;

## 2011-11-05 NOTE — [facility] Note (Signed)
Patient states she has had a Mirena IUD since 2010. Has been having headaches for 2 days getting worse with nausea. Has been having abdominal pain for 4 days that is getting worse, sharp and shooting. Had irregular cycle with last period in April.

## 2011-11-06 NOTE — [facility] Provider Note (Signed)
Medical Screening exam and patient care preformed by advanced practice provider.  Agree with the above management.  

## 2011-11-16 ENCOUNTER — Telehealth: Payer: Self-pay | Admitting: *Deleted

## 2011-11-16 NOTE — Telephone Encounter (Signed)
Pt left message stating that she was seen @ [facility] recently for c/o abdominal pain and migraine headache.  She states that her migraine was treated but her main problem was the abd/pelvic pain and it was not treated. She still has pelvic pain and her appt is not until 12/02/11.  She has an IUD and wants to know if she should consider having it removed as her migraines have been worse since its insertion. She is also concerned about her BP which was elevated @ time of visit to [facility]. She reports that she checks it everyday and it is still elevated- 160's/106-109.  I returned pt's call and left message that I was calling to discuss her concerns. I will call back later today or tomorrow morning.

## 2011-11-17 ENCOUNTER — Emergency Department (INDEPENDENT_AMBULATORY_CARE_PROVIDER_SITE_OTHER)
Admission: EM | Admit: 2011-11-17 | Discharge: 2011-11-17 | Disposition: A | Discharge status: Home / Self Care | Payer: Self-pay | Source: Home / Self Care | Attending: Emergency Medicine | Admitting: Emergency Medicine

## 2011-11-17 ENCOUNTER — Encounter (HOSPITAL_COMMUNITY): Payer: Self-pay | Admitting: *Deleted

## 2011-11-17 DIAGNOSIS — R51 Headache: Secondary | ICD-10-CM

## 2011-11-17 MED ORDER — METOCLOPRAMIDE HCL 5 MG/ML IJ SOLN
INTRAMUSCULAR | Status: AC
  Filled 2011-11-17: qty 2

## 2011-11-17 MED ORDER — SUMATRIPTAN SUCCINATE 100 MG PO TABS: 100.0000 mg | ORAL_TABLET | ORAL | Status: DC | PRN

## 2011-11-17 MED ORDER — DIPHENHYDRAMINE HCL 50 MG/ML IJ SOLN
50.0000 mg | Freq: Once | INTRAMUSCULAR | Status: AC
  Administered 2011-11-17: 50 mg via INTRAMUSCULAR

## 2011-11-17 MED ORDER — DIPHENHYDRAMINE HCL 50 MG/ML IJ SOLN
INTRAMUSCULAR | Status: AC
  Filled 2011-11-17: qty 1

## 2011-11-17 MED ORDER — KETOROLAC TROMETHAMINE 60 MG/2ML IM SOLN
60.0000 mg | Freq: Once | INTRAMUSCULAR | Status: AC
  Administered 2011-11-17: 60 mg via INTRAMUSCULAR

## 2011-11-17 MED ORDER — KETOROLAC TROMETHAMINE 60 MG/2ML IM SOLN
INTRAMUSCULAR | Status: AC
  Filled 2011-11-17: qty 2

## 2011-11-17 MED ORDER — METOCLOPRAMIDE HCL 5 MG/ML IJ SOLN
10.0000 mg | Freq: Once | INTRAMUSCULAR | Status: AC
  Administered 2011-11-17: 10 mg via INTRAMUSCULAR

## 2011-11-17 NOTE — ED Provider Notes (Signed)
History     CSN: [id number]  Arrival date & time 11/17/11  1753   None     Chief Complaint  Patient presents with  . Migraine    (Consider location/radiation/quality/duration/timing/severity/associated sxs/prior treatment) Patient is a 36 y.o. female presenting with headaches. The history is provided by the patient. No language interpreter was used.  Headache The primary symptoms include headaches. Episode onset: 2 weeks. The symptoms are worsening.  Associated symptoms comments: Nausea and vomiting. Associated medical issues comments: Migraine headaches. Procedure history comments: Neurologist 3 years ago.   Patient is also concerned about her blood pressure [facility] that [facility] had high blood pressure there. Patient reports her blood pressure was 170/110 at work today. Past Medical History  Diagnosis Date  . Headache   . Anemia   . Depression   . Migraine headache with aura     Past Surgical History  Procedure Date  . Cholecystectomy   . Wisdom tooth extraction   . Dilation and curettage of uterus     Family History  Problem Relation Age of Onset  . Cancer Maternal Grandmother   . Cancer Maternal Grandfather   . Cancer Paternal Grandmother   . Cancer Paternal Grandfather     History  Substance Use Topics  . Smoking status: Never Smoker   . Smokeless tobacco: Not on file  . Alcohol Use: No    OB History    Grav Para Term Preterm Abortions TAB SAB Ect Mult Living   4 3 3  1  1   3       Review of Systems  Neurological: Positive for headaches.  All other systems reviewed and are negative.    Allergies  Latex  Home Medications   Current Outpatient Rx  Name Route Sig Dispense Refill  . IBUPROFEN 600 MG PO TABS Oral Take 600 mg by mouth every 6 (six) hours as needed.    . ASPIRIN-ACETAMINOPHEN-CAFFEINE 250-250-65 MG PO TABS Oral Take 1 tablet by mouth every 6 (six) hours as needed. migrain      BP 139/98  Pulse 90   Temp(Src) 97.4 F (36.3 C) (Oral)  Resp 18  SpO2 98%  LMP 09/21/2011  Physical Exam  Nursing note and vitals reviewed. Constitutional: She is oriented to person, place, and time. She appears well-developed and well-nourished.       Tearful  HENT:  Head: Normocephalic and atraumatic.  Right Ear: External ear normal.  Left Ear: External ear normal.  Nose: Nose normal.  Mouth/Throat: Oropharynx is clear and moist.  Eyes: Conjunctivae are normal. Pupils are equal, round, and reactive to light.  Neck: Normal range of motion. Neck supple.  Cardiovascular: Normal rate and normal heart sounds.   Pulmonary/Chest: Effort normal.  Musculoskeletal: Normal range of motion.  Neurological: She is alert and oriented to person, place, and time. She has normal reflexes.  Skin: Skin is warm and dry.  Psychiatric: She has a normal mood and affect.    ED Course  Procedures (including critical care time)  Labs Reviewed - No data to display No results found.   1. Headache       MDM  Patient given injection of Toradol Reglan and Benadryl I advised patient blood pressure is borderline I advised exercise, sodium limitation,  Weight loss.   Pt given numbers for primary care MD.     Patient reports she feels better after injection feels like she can home and rest. Patient advised  to go to the emergency department if symptoms worsen or change      Elson Areas, [place] 11/17/11 1851  Lonia Skinner [place], [place] 11/17/11 [phone]

## 2011-11-17 NOTE — ED Notes (Signed)
Pt reports a constant headache the past 2 weeks - right frontal area associated with nausea and she vomited 2 Xs today.   She  Also has felt anxiety lately with some left anterior chest pain and SOB.    Her BP was 170/110 at work today

## 2011-11-17 NOTE — Discharge Instructions (Signed)
Headaches, Frequently Asked Questions MIGRAINE HEADACHES Q: What is migraine? What causes it? How can I treat it? A: Generally, migraine headaches begin as a dull ache. Then they develop into a constant, throbbing, and pulsating pain. You may experience pain at the temples. You may experience pain at the front or back of one or both sides of the head. The pain is usually accompanied by a combination of:  Nausea.   Vomiting.   Sensitivity to light and noise.  Some people (about 15%) experience an aura (see below) before an attack. The cause of migraine is believed to be chemical reactions in the brain. Treatment for migraine may include over-the-counter or prescription medications. It may also include self-help techniques. These include relaxation training and biofeedback.  Q: What is an aura? A: About 15% of people with migraine get an "aura". This is a sign of neurological symptoms that occur before a migraine headache. You may see wavy or jagged lines, dots, or flashing lights. You might experience tunnel vision or blind spots in one or both eyes. The aura can include visual or auditory hallucinations (something imagined). It may include disruptions in smell (such as strange odors), taste or touch. Other symptoms include:  Numbness.   A "pins and needles" sensation.   Difficulty in recalling or speaking the correct word.  These neurological events may last as long as 60 minutes. These symptoms will fade as the headache begins. Q: What is a trigger? A: Certain physical or environmental factors can lead to or "trigger" a migraine. These include:  Foods.   Hormonal changes.   Weather.   Stress.  It is important to remember that triggers are different for everyone. To help prevent migraine attacks, you need to figure out which triggers affect you. Keep a headache diary. This is a good way to track triggers. The diary will help you talk to your healthcare professional about your  condition. Q: Does weather affect migraines? A: Bright sunshine, hot, humid conditions, and drastic changes in barometric pressure may lead to, or "trigger," a migraine attack in some people. But studies have shown that weather does not act as a trigger for everyone with migraines. Q: What is the link between migraine and hormones? A: Hormones start and regulate many of your body's functions. Hormones keep your body in balance within a constantly changing environment. The levels of hormones in your body are unbalanced at times. Examples are during menstruation, pregnancy, or menopause. That can lead to a migraine attack. In fact, about three quarters of all women with migraine report that their attacks are related to the menstrual cycle.  Q: Is there an increased risk of stroke for migraine sufferers? A: The likelihood of a migraine attack causing a stroke is very remote. That is not to say that migraine sufferers cannot have a stroke associated with their migraines. In persons under age 40, the most common associated factor for stroke is migraine headache. But over the course of a person's normal life span, the occurrence of migraine headache may actually be associated with a reduced risk of dying from cerebrovascular disease due to stroke.  Q: What are acute medications for migraine? A: Acute medications are used to treat the pain of the headache after it has started. Examples over-the-counter medications, NSAIDs, ergots, and triptans.  Q: What are the triptans? A: Triptans are the newest class of abortive medications. They are specifically targeted to treat migraine. Triptans are vasoconstrictors. They moderate some chemical reactions in the brain.   The triptans work on receptors in your brain. Triptans help to restore the balance of a neurotransmitter called serotonin. Fluctuations in levels of serotonin are thought to be a main cause of migraine.  Q: Are over-the-counter medications for migraine  effective? A: Over-the-counter, or "OTC," medications may be effective in relieving mild to moderate pain and associated symptoms of migraine. But you should see your caregiver before beginning any treatment regimen for migraine.  Q: What are preventive medications for migraine? A: Preventive medications for migraine are sometimes referred to as "prophylactic" treatments. They are used to reduce the frequency, severity, and length of migraine attacks. Examples of preventive medications include antiepileptic medications, antidepressants, beta-blockers, calcium channel blockers, and NSAIDs (nonsteroidal anti-inflammatory drugs). Q: Why are anticonvulsants used to treat migraine? A: During the past few years, there has been an increased interest in antiepileptic drugs for the prevention of migraine. They are sometimes referred to as "anticonvulsants". Both epilepsy and migraine may be caused by similar reactions in the brain.  Q: Why are antidepressants used to treat migraine? A: Antidepressants are typically used to treat people with depression. They may reduce migraine frequency by regulating chemical levels, such as serotonin, in the brain.  Q: What alternative therapies are used to treat migraine? A: The term "alternative therapies" is often used to describe treatments considered outside the scope of conventional Western medicine. Examples of alternative therapy include acupuncture, acupressure, and yoga. Another common alternative treatment is herbal therapy. Some herbs are believed to relieve headache pain. Always discuss alternative therapies with your caregiver before proceeding. Some herbal products contain arsenic and other toxins. TENSION HEADACHES Q: What is a tension-type headache? What causes it? How can I treat it? A: Tension-type headaches occur randomly. They are often the result of temporary stress, anxiety, fatigue, or anger. Symptoms include soreness in your temples, a tightening  band-like sensation around your head (a "vice-like" ache). Symptoms can also include a pulling feeling, pressure sensations, and contracting head and neck muscles. The headache begins in your forehead, temples, or the back of your head and neck. Treatment for tension-type headache may include over-the-counter or prescription medications. Treatment may also include self-help techniques such as relaxation training and biofeedback. CLUSTER HEADACHES Q: What is a cluster headache? What causes it? How can I treat it? A: Cluster headache gets its name because the attacks come in groups. The pain arrives with little, if any, warning. It is usually on one side of the head. A tearing or bloodshot eye and a runny nose on the same side of the headache may also accompany the pain. Cluster headaches are believed to be caused by chemical reactions in the brain. They have been described as the most severe and intense of any headache type. Treatment for cluster headache includes prescription medication and oxygen. SINUS HEADACHES Q: What is a sinus headache? What causes it? How can I treat it? A: When a cavity in the bones of the face and skull (a sinus) becomes inflamed, the inflammation will cause localized pain. This condition is usually the result of an allergic reaction, a tumor, or an infection. If your headache is caused by a sinus blockage, such as an infection, you will probably have a fever. An x-ray will confirm a sinus blockage. Your caregiver's treatment might include antibiotics for the infection, as well as antihistamines or decongestants.  REBOUND HEADACHES Q: What is a rebound headache? What causes it? How can I treat it? A: A pattern of taking acute headache medications too   often can lead to a condition known as "rebound headache." A pattern of taking too much headache medication includes taking it more than 2 days per week or in excessive amounts. That means more than the label or a caregiver advises.  With rebound headaches, your medications not only stop relieving pain, they actually begin to cause headaches. Doctors treat rebound headache by tapering the medication that is being overused. Sometimes your caregiver will gradually substitute a different type of treatment or medication. Stopping may be a challenge. Regularly overusing a medication increases the potential for serious side effects. Consult a caregiver if you regularly use headache medications more than 2 days per week or more than the label advises. ADDITIONAL QUESTIONS AND ANSWERS Q: What is biofeedback? A: Biofeedback is a self-help treatment. Biofeedback uses special equipment to monitor your body's involuntary physical responses. Biofeedback monitors:  Breathing.   Pulse.   Heart rate.   Temperature.   Muscle tension.   Brain activity.  Biofeedback helps you refine and perfect your relaxation exercises. You learn to control the physical responses that are related to stress. Once the technique has been mastered, you do not need the equipment any more. Q: Are headaches hereditary? A: Four out of five (80%) of people that suffer report a family history of migraine. Scientists are not sure if this is genetic or a family predisposition. Despite the uncertainty, a child has a 50% chance of having migraine if one parent suffers. The child has a 75% chance if both parents suffer.  Q: Can children get headaches? A: By the time they reach high school, most young people have experienced some type of headache. Many safe and effective approaches or medications can prevent a headache from occurring or stop it after it has begun.  Q: What type of doctor should I see to diagnose and treat my headache? A: Start with your primary caregiver. Discuss his or her experience and approach to headaches. Discuss methods of classification, diagnosis, and treatment. Your caregiver may decide to recommend you to a headache specialist, depending upon  your symptoms or other physical conditions. Having diabetes, allergies, etc., may require a more comprehensive and inclusive approach to your headache. The National Headache Foundation will provide, upon request, a list of [facility] physician members in your state. Document Released: 08/15/2003 Document Revised: 05/14/2011 Document Reviewed: 01/23/2008 [facility] Patient Information 2012 [place], [place].Hypertension As your heart beats, it forces blood through your arteries. This force is your blood pressure. If the pressure is too high, it is called hypertension (HTN) or high blood pressure. HTN is dangerous because you may have it and not know it. High blood pressure may mean that your heart has to work harder to pump blood. Your arteries may be narrow or stiff. The extra work puts you at risk for heart disease, stroke, and other problems.  Blood pressure consists of two numbers, a higher number over a lower, 110/72, for example. It is stated as "110 over 72." The ideal is below 120 for the top number (systolic) and under 80 for the bottom (diastolic). Write down your blood pressure today. You should pay close attention to your blood pressure if you have certain conditions such as:  Heart failure.   Prior heart attack.   Diabetes   Chronic kidney disease.   Prior stroke.   Multiple risk factors for heart disease.  To see if you have HTN, your blood pressure should be measured while you are seated with your arm held at the  level of the heart. It should be measured at least twice. A one-time elevated blood pressure reading (especially in the Emergency Department) does not mean that you need treatment. There may be conditions in which the blood pressure is different between your right and left arms. It is important to see your caregiver soon for a recheck. Most people have essential hypertension which means that there is not a specific cause. This type of high blood pressure may be lowered by changing  lifestyle factors such as:  Stress.   Smoking.   Lack of exercise.   Excessive weight.   Drug/tobacco/alcohol use.   Eating less salt.  Most people do not have symptoms from high blood pressure until it has caused damage to the body. Effective treatment can often prevent, delay or reduce that damage. TREATMENT  When a cause has been identified, treatment for high blood pressure is directed at the cause. There are a large number of medications to treat HTN. These fall into several categories, and your caregiver will help you select the medicines that are best for you. Medications may have side effects. You should review side effects with your caregiver. If your blood pressure stays high after you have made lifestyle changes or started on medicines,   Your medication(s) may need to be changed.   Other problems may need to be addressed.   Be certain you understand your prescriptions, and know how and when to take your medicine.   Be sure to follow up with your caregiver within the time frame advised (usually within two weeks) to have your blood pressure rechecked and to review your medications.   If you are taking more than one medicine to lower your blood pressure, make sure you know how and at what times they should be taken. Taking two medicines at the same time can result in blood pressure that is too low.  SEEK IMMEDIATE MEDICAL CARE IF:  You develop a severe headache, blurred or changing vision, or confusion.   You have unusual weakness or numbness, or a faint feeling.   You have severe chest or abdominal pain, vomiting, or breathing problems.  MAKE SURE YOU:   Understand these instructions.   Will watch your condition.   Will get help right away if you are not doing well or get worse.  Document Released: 05/25/2005 Document Revised: 05/14/2011 Document Reviewed: 01/13/2008 [facility] Patient Information 2012 [place], [place].

## 2011-11-17 NOTE — Telephone Encounter (Signed)
Called and spoke w/pt re: her concerns. I advised her to take ibuprofen or aleve for her pelvic pain and we discussed appropriate dosages of each of these medications.  Pt stated that she has tried these medications and they do not always help her pain. I stated that she can discuss further @ her appt on 6/26 but that until she is evaluated @ our clinic, the doctor will not prescribe any other medications.  She may return to [facility] if her pain becomes severe.  In regards to her elevated BP, I explained that our doctors are OB/Gyn specialists and they do not manage BP issues except while a pt is pregnant. She will need to be seen by a PCP or go to an Urgent Care facility for evaluation. I encouraged pt to do this soon as her reported BP readings are in fact high.  Pt also asked about having her Mirena IUD removed.  She wanted to know if she has to return to [facility] for removal because it was inserted at that practice in 2010.  I told her that she should keep all of her Gyn care at 1 practice.  Pt states that she has no insurance currently and does not think she can go there.  I stated that she should discuss her concerns about the Mirena  w/Dr. Marice Potter at her visit on 12/02/11. Pt voiced understanding of all instructions and advice.

## 2011-11-17 NOTE — ED Provider Notes (Signed)
Medical screening examination/treatment/procedure(s) were performed by non-physician practitioner and as supervising physician I was immediately available for consultation/collaboration.  Usman Millett, M.D.   Raine Elsass C Henry Demeritt, MD 11/17/11 2121 

## 2011-12-02 ENCOUNTER — Ambulatory Visit (INDEPENDENT_AMBULATORY_CARE_PROVIDER_SITE_OTHER): Payer: Self-pay | Admitting: Obstetrics & Gynecology

## 2011-12-02 ENCOUNTER — Encounter: Payer: Self-pay | Admitting: Obstetrics & Gynecology

## 2011-12-02 ENCOUNTER — Other Ambulatory Visit: Payer: Self-pay | Admitting: Obstetrics & Gynecology

## 2011-12-02 VITALS — BP 124/90 | HR 95 | Temp 99.7°F | Ht 67.5 in | Wt 243.6 lb

## 2011-12-02 DIAGNOSIS — N938 Other specified abnormal uterine and vaginal bleeding: Secondary | ICD-10-CM

## 2011-12-02 DIAGNOSIS — N949 Unspecified condition associated with female genital organs and menstrual cycle: Secondary | ICD-10-CM

## 2011-12-02 DIAGNOSIS — R102 Pelvic and perineal pain: Secondary | ICD-10-CM

## 2011-12-02 DIAGNOSIS — Z01419 Encounter for gynecological examination (general) (routine) without abnormal findings: Secondary | ICD-10-CM

## 2011-12-02 DIAGNOSIS — Z0000 Encounter for general adult medical examination without abnormal findings: Secondary | ICD-10-CM

## 2011-12-02 MED ORDER — NORGESTREL-ETHINYL ESTRADIOL 0.3-30 MG-MCG PO TABS: 1.0000 | ORAL_TABLET | Freq: Every day | ORAL | Status: DC

## 2011-12-02 NOTE — Progress Notes (Signed)
  Subjective:    Patient ID: Shirley Ponce, female    DOB: April 02, 1976, 36 y.o.   MRN: [id number]  HPI  36 yo P3 who is here for 2 year h/o pelvic pain and irregular periods. She describes the pain as constant, increasing in severity at times, no relation to her periods.  She takes IBU with some small relief.  Her periods range from lasting 1 day to 20 days per month. She had an u/s last done in 2011 (normal)  Review of Systems Mirena in place Strong FH of breast cancer. Her mother's BRCA was negative.    Objective:   Physical Exam  Normal breast exam Uterus NSSA, mobile, slightly tender     Assessment & Plan:   DUB/pelvic pain- possibly related to Mirena. She will start OCPs today so that I can remove the Mirena next month if her studies are normal.

## 2011-12-31 ENCOUNTER — Telehealth: Payer: Self-pay | Admitting: Obstetrics & Gynecology

## 2011-12-31 NOTE — Telephone Encounter (Signed)
Patient called and stated due to her job she would not be able to make her appointment with Dr. Marice Potter on 01/08/12. She wants to wait till late September to reschedule.

## 2012-01-08 ENCOUNTER — Ambulatory Visit: Payer: Self-pay | Admitting: Obstetrics & Gynecology

## 2012-11-21 ENCOUNTER — Other Ambulatory Visit: Payer: Self-pay | Admitting: *Deleted

## 2012-11-21 DIAGNOSIS — N63 Unspecified lump in unspecified breast: Secondary | ICD-10-CM

## 2012-11-22 ENCOUNTER — Ambulatory Visit (HOSPITAL_COMMUNITY)
Admission: RE | Admit: 2012-11-22 | Discharge: 2012-11-22 | Disposition: A | Discharge status: Home / Self Care | Payer: Self-pay | Source: Ambulatory Visit | Attending: Obstetrics and Gynecology | Admitting: Obstetrics and Gynecology

## 2012-11-22 ENCOUNTER — Encounter (HOSPITAL_COMMUNITY): Payer: Self-pay

## 2012-11-22 VITALS — BP 116/72 | Temp 99.1°F | Ht 68.0 in | Wt 250.2 lb

## 2012-11-22 DIAGNOSIS — Z1239 Encounter for other screening for malignant neoplasm of breast: Secondary | ICD-10-CM

## 2012-11-22 DIAGNOSIS — N644 Mastodynia: Secondary | ICD-10-CM | POA: Insufficient documentation

## 2012-11-22 NOTE — Progress Notes (Signed)
Complaints of right breast lump x over 1 month that is tender to the touch. Patient stated she thinks lump has decreased in size.   Pap Smear:    Pap smear not completed today. Last Pap smear was 12/02/2011 at [facility] and normal. Per patient no history of an abnormal Pap smear. Last Pap smear result in EPIC.  Physical exam: Breasts Breasts symmetrical. No skin abnormalities bilateral breasts. No nipple retraction bilateral breasts. No nipple discharge bilateral breasts. No lymphadenopathy. No lumps palpated bilateral breasts. Unable to palpate lump in patients area of concern. Complaints of right center breast tenderness on exam. Referred patient to the [facility] for diagnostic mammogram. Appointment scheduled for Friday, December 02, 2012 at 0830.   Pelvic/Bimanual No Pap smear completed today since last Pap smear was 12/02/2011. Pap smear not indicated per BCCCP guidelines.

## 2012-11-22 NOTE — Patient Instructions (Signed)
Taught Terrilee Croak how to perform BSE and gave educational materials to take home. Patient did not need a Pap smear today due to last Pap smear was 12/02/2011. Told patient about free cervical cancer screenings to receive a Pap smear if would like one next year. Let her know [facility] will cover Pap smears every 3 years unless has a history of abnormal Pap smears. Referred patient to the [facility] for diagnostic mammogram. Appointment scheduled for Friday, December 02, 2012 at 0830. Patient aware of appointment and will be there. Terrilee Croak verbalized understanding.  Charmion Hapke, Kathaleen Maser, RN 9:33 AM

## 2012-11-22 NOTE — Addendum Note (Signed)
Encounter addended by: Saintclair Halsted, RN on: 11/22/2012 10:40 AM<BR>     Documentation filed: Visit Diagnoses

## 2012-12-02 ENCOUNTER — Other Ambulatory Visit: Payer: Self-pay

## 2012-12-07 ENCOUNTER — Other Ambulatory Visit: Payer: Self-pay

## 2012-12-08 ENCOUNTER — Ambulatory Visit
Admission: RE | Admit: 2012-12-08 | Discharge: 2012-12-08 | Disposition: A | Discharge status: Home / Self Care | Payer: No Typology Code available for payment source | Source: Ambulatory Visit | Attending: Obstetrics and Gynecology | Admitting: Obstetrics and Gynecology

## 2012-12-08 DIAGNOSIS — N63 Unspecified lump in unspecified breast: Secondary | ICD-10-CM

## 2013-11-10 ENCOUNTER — Emergency Department (HOSPITAL_COMMUNITY)
Admission: EM | Admit: 2013-11-10 | Discharge: 2013-11-10 | Disposition: A | Discharge status: Home / Self Care | Payer: BC Managed Care – PPO | Attending: Emergency Medicine | Admitting: Emergency Medicine

## 2013-11-10 ENCOUNTER — Encounter (HOSPITAL_COMMUNITY): Payer: Self-pay | Admitting: Emergency Medicine

## 2013-11-10 DIAGNOSIS — G43909 Migraine, unspecified, not intractable, without status migrainosus: Secondary | ICD-10-CM

## 2013-11-10 DIAGNOSIS — Z91040 Latex allergy status: Secondary | ICD-10-CM | POA: Insufficient documentation

## 2013-11-10 DIAGNOSIS — Z8659 Personal history of other mental and behavioral disorders: Secondary | ICD-10-CM | POA: Insufficient documentation

## 2013-11-10 DIAGNOSIS — E669 Obesity, unspecified: Secondary | ICD-10-CM | POA: Insufficient documentation

## 2013-11-10 DIAGNOSIS — Z79899 Other long term (current) drug therapy: Secondary | ICD-10-CM | POA: Insufficient documentation

## 2013-11-10 DIAGNOSIS — D649 Anemia, unspecified: Secondary | ICD-10-CM | POA: Insufficient documentation

## 2013-11-10 DIAGNOSIS — Z791 Long term (current) use of non-steroidal anti-inflammatories (NSAID): Secondary | ICD-10-CM | POA: Insufficient documentation

## 2013-11-10 DIAGNOSIS — H8110 Benign paroxysmal vertigo, unspecified ear: Secondary | ICD-10-CM | POA: Insufficient documentation

## 2013-11-10 LAB — URINALYSIS, ROUTINE W REFLEX MICROSCOPIC
Bilirubin Urine: NEGATIVE
Glucose, UA: NEGATIVE mg/dL
Hgb urine dipstick: NEGATIVE
KETONES UR: NEGATIVE mg/dL
NITRITE: NEGATIVE
PH: 8 (ref 5.0–8.0)
PROTEIN: NEGATIVE mg/dL
Specific Gravity, Urine: 1.012 (ref 1.005–1.030)
UROBILINOGEN UA: 0.2 mg/dL (ref 0.0–1.0)

## 2013-11-10 LAB — URINE MICROSCOPIC-ADD ON

## 2013-11-10 MED ORDER — MECLIZINE HCL 25 MG PO TABS
12.5000 mg | ORAL_TABLET | Freq: Once | ORAL | Status: AC
  Administered 2013-11-10: 12.5 mg via ORAL
  Filled 2013-11-10: qty 1

## 2013-11-10 MED ORDER — MECLIZINE HCL 12.5 MG PO TABS: 12.5000 mg | ORAL_TABLET | Freq: Three times a day (TID) | ORAL | Status: DC | PRN

## 2013-11-10 MED ORDER — KETOROLAC TROMETHAMINE 30 MG/ML IJ SOLN
30.0000 mg | Freq: Once | INTRAMUSCULAR | Status: AC
  Administered 2013-11-10: 30 mg via INTRAVENOUS
  Filled 2013-11-10: qty 1

## 2013-11-10 MED ORDER — METOCLOPRAMIDE HCL 5 MG/ML IJ SOLN
10.0000 mg | Freq: Once | INTRAMUSCULAR | Status: AC
Start: 2013-11-10 — End: 2013-11-10
  Administered 2013-11-10: 10 mg via INTRAVENOUS
  Filled 2013-11-10: qty 2

## 2013-11-10 MED ORDER — SODIUM CHLORIDE 0.9 % IV BOLUS (SEPSIS)
500.0000 mL | Freq: Once | INTRAVENOUS | Status: AC
  Administered 2013-11-10: 500 mL via INTRAVENOUS

## 2013-11-10 MED ORDER — PROMETHAZINE HCL 12.5 MG PO TABS: 12.5000 mg | ORAL_TABLET | Freq: Four times a day (QID) | ORAL | Status: DC | PRN

## 2013-11-10 MED ORDER — DIPHENHYDRAMINE HCL 50 MG/ML IJ SOLN
12.5000 mg | Freq: Once | INTRAMUSCULAR | Status: AC
Start: 2013-11-10 — End: 2013-11-10
  Administered 2013-11-10: 12.5 mg via INTRAVENOUS
  Filled 2013-11-10: qty 1

## 2013-11-10 MED ORDER — DIPHENHYDRAMINE HCL 50 MG/ML IJ SOLN
25.0000 mg | Freq: Once | INTRAMUSCULAR | Status: DC
Start: 2013-11-10 — End: 2013-11-10

## 2013-11-10 NOTE — ED Notes (Signed)
Pt reports she is feeling better. Reports she is ready to go home with medication. Pt tolerated small amount of [place] sandwich. Is drinking cranberry juice.

## 2013-11-10 NOTE — ED Provider Notes (Signed)
CSN: [id number]     Arrival date & time 11/10/13  1021 History   First MD Initiated Contact with Patient 11/10/13 1027     Chief Complaint  Patient presents with  . Nausea  . Dizziness     (Consider location/radiation/quality/duration/timing/severity/associated sxs/prior Treatment) HPI  Shirley Ponce is a 38 y.o. female PMH migraine who presents with a chief complaint of dizziness.  Patient reports this morning she woke up with dizziness, which she describes as a sensation of the room spinning. The dizziness got a little bit better, so she went to work. When she sat at her desk, however, the sensation returned. She went to see the nurse at work, who recommended she come to the emergency room.  The dizziness is associated with headache, nausea, and one episode of vomiting. She is also having some light sensitivity. She denies weakness, numbness, dysphasia, dysarthria, blurry vision. She has history of migraines in her home med list includes sumatriptan, naproxen, Excedrin Migraine.  She has 3 children, currently using a Mirena IUD.   Past Medical History  Diagnosis Date  . Headache(784.0)   . Anemia   . Depression   . Migraine headache with aura   . Obesity (BMI 35.0-39.9 without comorbidity)    Past Surgical History  Procedure Laterality Date  . Cholecystectomy    . Wisdom tooth extraction    . Dilation and curettage of uterus    . Cesarean section  2010   Family History  Problem Relation Age of Onset  . Breast cancer Maternal Grandmother   . Breast cancer Paternal Grandmother   . Breast cancer Mother   . Cancer Father     lung cancer  . Thyroid disease Sister   . Hypertension Sister    History  Substance Use Topics  . Smoking status: Never Smoker   . Smokeless tobacco: Not on file  . Alcohol Use: No   OB History   Grav Para Term Preterm Abortions TAB SAB Ect Mult Living   4 3 3  1  1   3      Review of Systems  Constitutional: Negative for fever and chills.   Eyes: Positive for photophobia. Negative for pain and visual disturbance.  Respiratory: Negative for shortness of breath.   Cardiovascular: Negative for chest pain.  Gastrointestinal: Positive for nausea, vomiting and abdominal pain (Suprapubic).  Genitourinary: Negative for dysuria and frequency.  Musculoskeletal: Negative for neck pain and neck stiffness.  Neurological: Positive for dizziness and headaches. Negative for tremors, seizures, syncope, facial asymmetry, speech difficulty, weakness, light-headedness and numbness.      Allergies  Latex and Mushroom extract complex  Home Medications   Prior to Admission medications   Medication Sig Start Date End Date Taking? Authorizing Provider  aspirin-acetaminophen-caffeine (EXCEDRIN MIGRAINE) [phone] MG per tablet Take 1 tablet by mouth every 6 (six) hours as needed for migraine.    Yes Historical Provider, MD  ferrous sulfate 325 (65 FE) MG tablet Take 325 mg by mouth daily with breakfast.   Yes Historical Provider, MD  ibuprofen (ADVIL,MOTRIN) 600 MG tablet Take 600 mg by mouth every 6 (six) hours as needed for mild pain.    Yes Historical Provider, MD  Multiple Vitamin (MULTIVITAMIN) tablet Take 1 tablet by mouth daily.   Yes Historical Provider, MD  naproxen sodium (ANAPROX) 220 MG tablet Take 220 mg by mouth 2 (two) times daily with a meal.   Yes Historical Provider, MD  SUMAtriptan (IMITREX) 100 MG tablet Take 100  mg by mouth every 2 (two) hours as needed for migraine or headache. May repeat in 2 hours if headache persists or recurs.   Yes Historical Provider, MD  meclizine (ANTIVERT) 12.5 MG tablet Take 1 tablet (12.5 mg total) by mouth 3 (three) times daily as needed for dizziness. 11/10/13   Lesly Dukes, MD  promethazine (PHENERGAN) 12.5 MG tablet Take 1 tablet (12.5 mg total) by mouth every 6 (six) hours as needed for nausea or vomiting. 11/10/13   Lesly Dukes, MD   BP 143/82  Pulse 81  Temp(Src) 99 F (37.2 C) (Oral)  Resp  15  Ht 5\' 8"  (1.727 m)  Wt 257 lb (116.574 kg)  BMI 39.09 kg/m2  SpO2 96% Physical Exam  Constitutional: She is oriented to person, place, and time. She appears well-developed and well-nourished.  Lying in bed with lights off and eyes closed.  HENT:  Head: Normocephalic and atraumatic.  Eyes: Conjunctivae are normal. Pupils are equal, round, and reactive to light. Right eye exhibits nystagmus. Left eye exhibits nystagmus (Predominantly left-beating on left gaze).  Neck: Normal range of motion. Neck supple.  Cardiovascular: Normal rate and regular rhythm.   No murmur heard. Pulmonary/Chest: Effort normal and breath sounds normal. No respiratory distress. She has no wheezes. She has no rales. She exhibits no tenderness.  Abdominal: Soft. Bowel sounds are normal. She exhibits no distension and no mass. There is tenderness (Mild tenderness in the suprapubic area). There is no rebound and no guarding.  Musculoskeletal: Normal range of motion. She exhibits no edema and no tenderness.  Neurological: She is alert and oriented to person, place, and time. No cranial nerve deficit.  Skin: Skin is warm and dry.  Psychiatric: She has a normal mood and affect.    ED Course  Procedures (including critical care time) Labs Review Labs Reviewed  URINALYSIS, ROUTINE W REFLEX MICROSCOPIC - Abnormal; Notable for the following:    APPearance CLOUDY (*)    Leukocytes, UA SMALL (*)    All other components within normal limits  URINE MICROSCOPIC-ADD ON - Abnormal; Notable for the following:    Squamous Epithelial / LPF FEW (*)    Bacteria, UA FEW (*)    All other components within normal limits    Imaging Review No results found.   EKG Interpretation None      MDM   Final diagnoses:  Migraine headache  Vertigo, benign paroxysmal    37yo patient with history of migraines presents with dizziness, headache, photophobia, nausea, vomiting since this morning. This is most likely a migraine  headache. No other signs of central vestibular lesion such as double vision, visual loss, slurred speech, numbness, weakness. Will treat with a migraine cocktail (Toradol, Reglan, Benadryl), meclizine, and a 500cc fluid bolus and monitor for improvement.  12:45PM Patient feels "a lot better." Headache is improved. She wants to try eating lunch. [place] sandwich provided. Will discharge if able to tolerate. Prescriptions provided for meclizine and Phenergan. Recommended the patient establish with a primary care doctor and consider neurology referral if symptoms return.   Lesly Dukes, MD 11/10/13 [phone]

## 2013-11-10 NOTE — ED Provider Notes (Signed)
I saw and evaluated the patient, reviewed the resident's note and I agree with the findings and plan. The patient is a 38 year old female with past medical history of headache, depression. She presents today with complaints of dizziness and headache. She denies any fevers or chills. She denies any injury or trauma.  On exam, vitals are stable the patient is afebrile. Head is atraumatic, normocephalic. Neck is supple. Heart is regular rate and rhythm and lungs are clear and equal. Neurologically, cranial nerves II through XII are grossly intact without focal deficits. Strength is 5 out of 5 in both upper and lower extremities. There is no papilledema.  Workup reveals an unremarkable EKG and urinalysis is unremarkable as well. She is feeling better with medications given in the ER. I suspect this is either vertigo or a complex migraine headache. There are no red flags that would indicate an LP or CT. She will be discharged to home with when necessary followup.   EKG Interpretation   Date/Time:  Friday November 10 2013 10:27:29 EDT Ventricular Rate:  79 PR Interval:  163 QRS Duration: 80 QT Interval:  366 QTC Calculation: 419 R Axis:   41 Text Interpretation:  Sinus rhythm RSR' in V1 or V2, right VCD or RVH  Confirmed by Beau Fanny  MD, Emmarose Klinke ([id number]) on 11/10/2013 3:57:45 PM        Veryl Speak, MD 11/10/13 1559

## 2013-11-10 NOTE — Discharge Instructions (Signed)
It was a pleasure taking care of you. - I have prescribed meclizine, a medicine for dizziness, and Phenergan, a medicine for nausea. Please take these as needed, dosage instructions are on your prescriptions. - Please establish care with a primary care doctor. I provided you with a phone number to call. If your dizziness continues or flares up again, please see them about referral to a neurologist. A neurologist is a brain doctor. - If you develop sudden weakness, numbness, trouble speaking, trouble swallowing, double vision, please return to the ED.

## 2013-11-10 NOTE — ED Notes (Addendum)
Pt states that she began having dizziness when she woke and has been intermittent. Dizziness is now present when pt is lying still and is worse with movement. Pt also reports swelling in her legs for several days.  Pt also has had N/V x2. Pt has RLQ stabbing pain that started today. Bp 158/104 Hr 80 CBG 98

## 2014-04-09 ENCOUNTER — Encounter (HOSPITAL_COMMUNITY): Payer: Self-pay | Admitting: Emergency Medicine

## 2017-02-08 ENCOUNTER — Inpatient Hospital Stay (HOSPITAL_COMMUNITY)
Admission: AD | Admit: 2017-02-08 | Discharge: 2017-02-08 | Disposition: A | Discharge status: Home / Self Care | Payer: BLUE CROSS/BLUE SHIELD | Source: Ambulatory Visit | Attending: Obstetrics & Gynecology | Admitting: Obstetrics & Gynecology

## 2017-02-08 ENCOUNTER — Encounter (HOSPITAL_COMMUNITY): Payer: Self-pay

## 2017-02-08 DIAGNOSIS — Z3202 Encounter for pregnancy test, result negative: Secondary | ICD-10-CM | POA: Diagnosis not present

## 2017-02-08 DIAGNOSIS — Z79899 Other long term (current) drug therapy: Secondary | ICD-10-CM | POA: Insufficient documentation

## 2017-02-08 DIAGNOSIS — N921 Excessive and frequent menstruation with irregular cycle: Secondary | ICD-10-CM | POA: Diagnosis not present

## 2017-02-08 DIAGNOSIS — N920 Excessive and frequent menstruation with regular cycle: Secondary | ICD-10-CM | POA: Diagnosis not present

## 2017-02-08 DIAGNOSIS — Z6835 Body mass index (BMI) 35.0-35.9, adult: Secondary | ICD-10-CM | POA: Diagnosis not present

## 2017-02-08 DIAGNOSIS — E669 Obesity, unspecified: Secondary | ICD-10-CM | POA: Diagnosis not present

## 2017-02-08 DIAGNOSIS — Z7982 Long term (current) use of aspirin: Secondary | ICD-10-CM | POA: Diagnosis not present

## 2017-02-08 DIAGNOSIS — R109 Unspecified abdominal pain: Secondary | ICD-10-CM | POA: Diagnosis not present

## 2017-02-08 DIAGNOSIS — N939 Abnormal uterine and vaginal bleeding, unspecified: Secondary | ICD-10-CM | POA: Diagnosis present

## 2017-02-08 DIAGNOSIS — F419 Anxiety disorder, unspecified: Secondary | ICD-10-CM | POA: Diagnosis not present

## 2017-02-08 DIAGNOSIS — F329 Major depressive disorder, single episode, unspecified: Secondary | ICD-10-CM | POA: Diagnosis not present

## 2017-02-08 HISTORY — DX: Anxiety disorder, unspecified: F41.9

## 2017-02-08 LAB — URINALYSIS, ROUTINE W REFLEX MICROSCOPIC
Bilirubin Urine: NEGATIVE
GLUCOSE, UA: NEGATIVE mg/dL
KETONES UR: NEGATIVE mg/dL
Nitrite: NEGATIVE
PROTEIN: 30 mg/dL — AB
Specific Gravity, Urine: 1.008 (ref 1.005–1.030)
pH: 6 (ref 5.0–8.0)

## 2017-02-08 LAB — CBC
HEMATOCRIT: 38.6 % (ref 36.0–46.0)
HEMOGLOBIN: 13.2 g/dL (ref 12.0–15.0)
MCH: 32 pg (ref 26.0–34.0)
MCHC: 34.2 g/dL (ref 30.0–36.0)
MCV: 93.5 fL (ref 78.0–100.0)
Platelets: 230 10*3/uL (ref 150–400)
RBC: 4.13 MIL/uL (ref 3.87–5.11)
RDW: 12.7 % (ref 11.5–15.5)
WBC: 4.9 10*3/uL (ref 4.0–10.5)

## 2017-02-08 LAB — POCT PREGNANCY, URINE: PREG TEST UR: NEGATIVE

## 2017-02-08 LAB — HCG, SERUM, QUALITATIVE: PREG SERUM: NEGATIVE

## 2017-02-08 MED ORDER — MEDROXYPROGESTERONE ACETATE 10 MG PO TABS: 10.0000 mg | ORAL_TABLET | Freq: Every day | ORAL | 0 refills | Status: DC

## 2017-02-08 MED ORDER — KETOROLAC TROMETHAMINE 30 MG/ML IJ SOLN
30.0000 mg | Freq: Once | INTRAMUSCULAR | Status: AC
  Administered 2017-02-08: 30 mg via INTRAMUSCULAR
  Filled 2017-02-08: qty 1

## 2017-02-08 NOTE — MAU Provider Note (Signed)
History     CSN: [id number]  Arrival date and time: 02/08/17 9798   First Provider Initiated Contact with Patient 02/08/17 [phone]      Chief Complaint  Patient presents with  . Vaginal Bleeding  . Abdominal Pain   [id number] here with VB x2 days. Saturated 6 pads over the last hr, passing golf ball sized clots. Some lower abdominal cramping, used Ibuprofen with little relief. Rates pain 9/10. She had 2 faintly positive pregnancy tests at home. LMP was 12/17/16. Recently had Mirena removed in May, not using contraception.   OB History    Gravida Para Term Preterm AB Living   4 3 3   1 3    SAB TAB Ectopic Multiple Live Births   1              Past Medical History:  Diagnosis Date  . Anemia   . Anxiety   . Depression   . Headache(784.0)   . Migraine headache with aura   . Obesity (BMI 35.0-39.9 without comorbidity)     Past Surgical History:  Procedure Laterality Date  . CESAREAN SECTION  2010  . CHOLECYSTECTOMY    . DILATION AND CURETTAGE OF UTERUS    . WISDOM TOOTH EXTRACTION      Family History  Problem Relation Age of Onset  . Breast cancer Maternal Grandmother   . Breast cancer Paternal Grandmother   . Cancer Father        lung cancer  . Thyroid disease Sister   . Hypertension Sister   . Breast cancer Mother     Social History  Substance Use Topics  . Smoking status: Never Smoker  . Smokeless tobacco: Not on file  . Alcohol use No    Allergies:  Allergies  Allergen Reactions  . Latex Hives  . Mushroom Extract Complex Swelling    Prescriptions Prior to Admission  Medication Sig Dispense Refill Last Dose  . busPIRone (BUSPAR) 10 MG tablet Take 10 mg by mouth 3 (three) times daily.   Past Month at Unknown time  . ibuprofen (ADVIL,MOTRIN) 600 MG tablet Take 600 mg by mouth every 6 (six) hours as needed for mild pain.    02/07/2017 at 2000  . SUMAtriptan (IMITREX) 100 MG tablet Take 100 mg by mouth every 2 (two) hours as needed for migraine or headache.  May repeat in 2 hours if headache persists or recurs.   Past Month at Unknown time  . aspirin-acetaminophen-caffeine (EXCEDRIN MIGRAINE) 250-250-65 MG per tablet Take 1 tablet by mouth every 6 (six) hours as needed for migraine.    unknown  . ferrous sulfate 325 (65 FE) MG tablet Take 325 mg by mouth daily with breakfast.   Past Week at Unknown time  . meclizine (ANTIVERT) 12.5 MG tablet Take 1 tablet (12.5 mg total) by mouth 3 (three) times daily as needed for dizziness. 30 tablet 0   . Multiple Vitamin (MULTIVITAMIN) tablet Take 1 tablet by mouth daily.   Past Week at Unknown time  . naproxen sodium (ANAPROX) 220 MG tablet Take 220 mg by mouth 2 (two) times daily with a meal.   Past Month at Unknown time  . promethazine (PHENERGAN) 12.5 MG tablet Take 1 tablet (12.5 mg total) by mouth every 6 (six) hours as needed for nausea or vomiting. 30 tablet 0     Review of Systems  Constitutional: Negative for fever.  Gastrointestinal: Positive for abdominal pain.  Genitourinary: Positive for vaginal bleeding. Negative for dysuria.  Physical Exam   Blood pressure (!) 158/99, pulse 74, temperature 98.2 F (36.8 C), temperature source Oral, resp. rate 19, last menstrual period 12/17/2016, SpO2 100 %.  Physical Exam  Constitutional: She is oriented to person, place, and time. She appears well-developed and well-nourished. No distress.  HENT:  Head: Normocephalic and atraumatic.  Neck: Normal range of motion.  Cardiovascular: Normal rate.   Respiratory: Effort normal. No respiratory distress.  Genitourinary:  Genitourinary Comments: External: no lesions or erythema Vagina: rugated, pink, moist, small amt bloody discharge, no active bleeding, no clots Uterus: non enlarged, anteverted, + tender, no CMT Adnexae: no masses, no tenderness left, no tenderness right   Musculoskeletal: Normal range of motion.  Neurological: She is alert and oriented to person, place, and time.  Skin: Skin is warm and  dry.  Psychiatric: She has a normal mood and affect.   Results for orders placed or performed during the hospital encounter of 02/08/17 (from the past 24 hour(s))  Urinalysis, Routine w reflex microscopic     Status: Abnormal   Collection Time: 02/08/17  4:05 AM  Result Value Ref Range   Color, Urine AMBER (A) YELLOW   APPearance HAZY (A) CLEAR   Specific Gravity, Urine 1.008 1.005 - 1.030   pH 6.0 5.0 - 8.0   Glucose, UA NEGATIVE NEGATIVE mg/dL   Hgb urine dipstick LARGE (A) NEGATIVE   Bilirubin Urine NEGATIVE NEGATIVE   Ketones, ur NEGATIVE NEGATIVE mg/dL   Protein, ur 30 (A) NEGATIVE mg/dL   Nitrite NEGATIVE NEGATIVE   Leukocytes, UA SMALL (A) NEGATIVE   RBC / HPF TOO NUMEROUS TO COUNT 0 - 5 RBC/hpf   WBC, UA 6-30 0 - 5 WBC/hpf   Bacteria, UA RARE (A) NONE SEEN   Squamous Epithelial / LPF 6-30 (A) NONE SEEN   Mucus PRESENT   CBC     Status: None   Collection Time: 02/08/17  4:11 AM  Result Value Ref Range   WBC 4.9 4.0 - 10.5 K/uL   RBC 4.13 3.87 - 5.11 MIL/uL   Hemoglobin 13.2 12.0 - 15.0 g/dL   HCT 38.6 36.0 - 46.0 %   MCV 93.5 78.0 - 100.0 fL   MCH 32.0 26.0 - 34.0 pg   MCHC 34.2 30.0 - 36.0 g/dL   RDW 12.7 11.5 - 15.5 %   Platelets 230 150 - 400 K/uL  hCG, serum, qualitative     Status: None   Collection Time: 02/08/17  4:11 AM  Result Value Ref Range   Preg, Serum NEGATIVE NEGATIVE  Pregnancy, urine POC     Status: None   Collection Time: 02/08/17  4:25 AM  Result Value Ref Range   Preg Test, Ur NEGATIVE NEGATIVE   MAU Course  Procedures Toradol  MDM Labs ordered and reviewed. No evidence of pregnancy or anemia. Presentation, clinical findings, and plan discussed with Dr. Benjie Karvonen, will use Provera to improve bleedeing. Stable for discharge home.  Assessment and Plan   1. Menorrhagia with irregular cycle   2. Pregnancy test negative    Discharge home Follow up with Dr. Murrell Redden in 1 week Rx Provera Return for worsening sx Ibuprofen prn  Allergies as  of 02/08/2017      Reactions   Latex Hives   Mushroom Extract Complex Swelling      Medication List    STOP taking these medications   ferrous sulfate 325 (65 FE) MG tablet     TAKE these medications   busPIRone 10 MG  tablet Commonly known as:  BUSPAR Take 10 mg by mouth 3 (three) times daily.   EXCEDRIN MIGRAINE 250-250-65 MG tablet Generic drug:  aspirin-acetaminophen-caffeine Take 1 tablet by mouth every 6 (six) hours as needed for migraine.   ibuprofen 600 MG tablet Commonly known as:  ADVIL,MOTRIN Take 600 mg by mouth every 6 (six) hours as needed for mild pain.   meclizine 12.5 MG tablet Commonly known as:  ANTIVERT Take 1 tablet (12.5 mg total) by mouth 3 (three) times daily as needed for dizziness.   medroxyPROGESTERone 10 MG tablet Commonly known as:  PROVERA Take 1 tablet (10 mg total) by mouth daily.   multivitamin tablet Take 1 tablet by mouth daily.   naproxen sodium 220 MG tablet Commonly known as:  ANAPROX Take 220 mg by mouth 2 (two) times daily with a meal.   promethazine 12.5 MG tablet Commonly known as:  PHENERGAN Take 1 tablet (12.5 mg total) by mouth every 6 (six) hours as needed for nausea or vomiting.   SUMAtriptan 100 MG tablet Commonly known as:  IMITREX Take 100 mg by mouth every 2 (two) hours as needed for migraine or headache. May repeat in 2 hours if headache persists or recurs.            Discharge Care Instructions        Start     Ordered   02/08/17 0000  Discharge patient    Question Answer Comment  Discharge disposition 01-Home or Self Care   Discharge patient date 02/08/2017      02/08/17 0455   02/08/17 0000  medroxyPROGESTERone (PROVERA) 10 MG tablet  Daily    Question:  Supervising Provider  Answer:  Verita Schneiders A   02/08/17 [place], CNM 02/08/2017, 4:03 AM

## 2017-02-08 NOTE — MAU Note (Signed)
Heavy bleeding/cramping starting on 02/06/17.  Hadn't had a period in 1.5 months prior to bleeding beginning.  Soaked through 3 pads tonight.  Took 2 HPTs on 8/31 which were faintly positive.

## 2017-02-08 NOTE — Discharge Instructions (Signed)
Menorrhagia Menorrhagia is when your menstrual periods are heavy or last longer than usual. Follow these instructions at home:  Only take medicine as told by your doctor.  Take any iron pills as told by your doctor. Heavy bleeding may cause low levels of iron in your body.  Do not take aspirin 1 week before or during your period. Aspirin can make the bleeding worse.  Lie down for a while if you change your tampon or pad more than once in 2 hours. This may help lessen the bleeding.  Eat a healthy diet and foods with iron. These foods include leafy green vegetables, meat, liver, eggs, and whole grain breads and cereals.  Do not try to lose weight. Wait until the heavy bleeding has stopped and your iron level is normal. Contact a doctor if:  You soak through a pad or tampon every 1 or 2 hours, and this happens every time you have a period.  You need to use pads and tampons at the same time because you are bleeding so much.  You need to change your pad or tampon during the night.  You have a period that lasts for more than 8 days.  You pass clots bigger than 1 inch (2.5 cm) wide.  You have irregular periods that happen more or less often than once a month.  You feel dizzy or pass out (faint).  You feel very weak or tired.  You feel short of breath or feel your heart is beating too fast when you exercise.  You feel sick to your stomach (nausea) and you throw up (vomit) while you are taking your medicine.  You have watery poop (diarrhea) while you are taking your medicine.  You have any problems that may be related to the medicine you are taking. Get help right away if:  You soak through 4 or more pads or tampons in 2 hours.  You have any bleeding while you are pregnant. This information is not intended to replace advice given to you by your health care provider. Make sure you discuss any questions you have with your health care provider. Document Released: 03/03/2008 Document  Revised: 10/31/2015 Document Reviewed: 11/24/2012 [employer]vier Interactive Patient Education  2017 [employer]  

## 2017-07-26 ENCOUNTER — Inpatient Hospital Stay: Payer: Medicaid Other | Attending: Oncology

## 2017-07-26 ENCOUNTER — Ambulatory Visit (HOSPITAL_BASED_OUTPATIENT_CLINIC_OR_DEPARTMENT_OTHER): Payer: Medicaid Other | Admitting: Genetics

## 2017-07-26 DIAGNOSIS — Z803 Family history of malignant neoplasm of breast: Secondary | ICD-10-CM

## 2017-07-26 DIAGNOSIS — Z800 Family history of malignant neoplasm of digestive organs: Secondary | ICD-10-CM | POA: Diagnosis not present

## 2017-07-27 ENCOUNTER — Encounter: Payer: Self-pay | Admitting: Genetics

## 2017-07-27 DIAGNOSIS — Z803 Family history of malignant neoplasm of breast: Secondary | ICD-10-CM | POA: Insufficient documentation

## 2017-07-27 DIAGNOSIS — Z800 Family history of malignant neoplasm of digestive organs: Secondary | ICD-10-CM | POA: Insufficient documentation

## 2017-07-27 NOTE — Progress Notes (Addendum)
REFERRING PROVIDER: Charyl Bigger, MD [place], [place] [id number]  PRIMARY PROVIDER:  Patient, No Pcp Per  PRIMARY REASON FOR VISIT:  1. Family history of breast cancer   2. Family history of colon cancer     HISTORY OF PRESENT ILLNESS:   Ms. Pownall, a 42 y.o. female, was seen for a [facility] cancer genetics consultation at the request of Dr. Murrell Redden due to a family history of cancer.  Ms. Looman presents to clinic today to discuss the possibility of a hereditary predisposition to cancer, genetic testing, and to further clarify her future cancer risks, as well as potential cancer risks for family members.   Ms. Nevares is a 42 y.o. female with no personal history of cancer.    CANCER HISTORY:   No history exists.     HORMONAL RISK FACTORS:  Menarche was at age 31.  First live birth at age 24.  OCP use for approximately 10 -15 years.  Ovaries intact: yes.  Hysterectomy: no.  Menopausal status: premenopausal.  HRT use: 0 years. Mammogram within the last year: no. Number of breast biopsies: 0  Past Medical History:  Diagnosis Date  . Anemia   . Anxiety   . Depression   . Family history of breast cancer   . Family history of colon cancer   . Headache(784.0)   . Migraine headache with aura   . Obesity (BMI 35.0-39.9 without comorbidity)     Past Surgical History:  Procedure Laterality Date  . CESAREAN SECTION  2010  . CHOLECYSTECTOMY    . DILATION AND CURETTAGE OF UTERUS    . WISDOM TOOTH EXTRACTION      Social History   Socioeconomic History  . Marital status: Single    Spouse name: None  . Number of children: None  . Years of education: None  . Highest education level: None  Social Needs  . Financial resource strain: None  . Food insecurity - worry: None  . Food insecurity - inability: None  . Transportation needs - medical: None  . Transportation needs - non-medical: None  Occupational History  . None  Tobacco Use  . Smoking status:  Never Smoker  Substance and Sexual Activity  . Alcohol use: No  . Drug use: No  . Sexual activity: Yes    Birth control/protection: IUD  Other Topics Concern  . None  Social History Narrative  . None     FAMILY HISTORY:  We obtained a detailed, 4-generation family history.  Significant diagnoses are listed below: Family History  Problem Relation Age of Onset  . Breast cancer Maternal Grandmother 23  . Breast cancer Paternal Grandmother        age dx unk  . Cancer Father 20       lung cancer, hx smoking  . Thyroid disease Sister   . Hypertension Sister   . Breast cancer Mother 53       left breast, dx right breast cancer at 18  . Colon cancer Mother 59  . Breast cancer Other        5 aternal great aunts with breast cancer in 52's   Ms. Custodio has 2 daughters ages 62 and 66 and an 54 year-old son.  Ms. Felling has a twin brother with no children, a brother who is 63 with 3 children, and a sister who is 59 with a daughter.  None of her siblings or nieces/nephews have had a diagnosis of cancer.   Ms. Souffrant  father died at 16 due to lung cancer dx at 58.  Ms. Tashiro has a paternal aunt whois 7 with no history  of cancer.  This aunt has fraternal twin daughters.  Ms. Lastra also has a paternal uncle who is 48 with no history of cancer- he has a daughter. Ms. Geier paternal grandfather is 18 with no histoyr of cancer.  Ms. Lahaie paternal grandmother was diagnosed with breast cancer at an unknown age.  This grandmother had 5 sisters (patient's great aunts) who develop breast cancer in their 4's.  One of these great aunts had a daughter (patient's 2nd cousin) with breast cancer in her 50's.    Ms. Stantz mother had left breast cancer at 36 and right breast cancer at 37.  Her mother also had colon cancer at 51.  Her mother has very recently had genetic testing on Jul 19, 2017 that was negative. VUS's in SDHB and VHL were identified in her mother's testing.   Her mother was tested for the  Common Hereditary Cancer Panel (47 genes).  Ms. Hsia has 5 maternal uncles with no history of cancer and 4 maternal aunts with no history of cancer.  Ms. Gopal maternal grandmother develped breast cancer in her 39's and died at 49.  Her maternal grandmother had a sister who had breast cancer in her 39's.    Patient's maternal ancestors are of African American/Native American/Irish descent, and paternal ancestors are of [place] and [place] descent. There is no reported Ashkenazi Jewish ancestry. There is no known consanguinity.  GENETIC COUNSELING ASSESSMENT: JUDIANNE SEIPLE is a 42 y.o. female with a family history which is somewhat suggestive of a Hereditary Cancer Predisposition Syndrome. We, therefore, discussed and recommended the following at today's visit.   DISCUSSION: We reviewed the characteristics, features and inheritance patterns of hereditary cancer syndromes. We also discussed genetic testing, including the appropriate family members to test, the process of testing, insurance coverage and turn-around-time for results. We discussed the implications of a negative, positive and/or variant of uncertain significant result. We recommended Ms. Habel pursue genetic testing for the Common Hereditary Cancer gene panel.  APC, ATM, AXIN2, BARD1, BMPR1A, BRCA1, BRCA2, BRIP1, CDH1, CDK4, CDKN2A (p14ARF), CDKN2A (p16INK4a), CHEK2, CTNNA1, DICER1, EPCAM, GREM1, KIT, MEN1, MLH1, MSH2, MSH3, MSH6, MUTYH, NBN, NF1, NHTL1, PALB2, PDGFRA, PMS2, POLD1, POLE, PTEN, RAD50, RAD51C, RAD51D, SDHB, SDHC, SDHD, SMAD4, SMARCA4. STK11, TP53, TSC1, TSC2, VHL, SDHA, HOXB13   We discussed that only 5-10% of cancers are associated with a Hereditary cancer predisposition syndrome.  One of the most common hereditary cancer syndromes that increases breast cancer risk is called Hereditary Breast and Ovarian Cancer (HBOC) syndrome.  This syndrome is caused by mutations in the BRCA1 and BRCA2 genes.  This syndrome increases an  individual's lifetime risk to develop breast, ovarian, pancreatic, and other types of cancer.  There are also many other cancer predisposition syndromes caused by mutations in several other genes.  We discussed that if she is found to have a mutation in one of these genes, it may impact future medical management recommendations such as increased cancer screenings and consideration of risk reducing surgeries.  A positive result could also have implications for the patient's family members.  A Negative result would mean we were unable to identify a hereditary component to her family's cancer, but does not rule out the possibility of a hereditary basis for her family's cancer.  There could be mutations that are undetectable by current technology, or in genes  not yet tested or identified to increase cancer risk.    We discussed the potential to find a Variant of Uncertain Significance or VUS.  These are variants that have not yet been identified as pathogenic or benign, and it is unknown if this variant is associated with increased cancer risk or if this is a normal finding.  Most VUS's are reclassified to benign or likely benign.   It should not be used to make medical management decisions. With time, we suspect the lab will determine the significance of any VUS's identified if any.   Based on Ms. Zelenak's paternal family history of cancer, she meets medical criteria for genetic testing. Despite that she meets criteria, she may still have an out of pocket cost. We discussed that if her out of pocket cost for testing is over $100, the laboratory will call and confirm whether she wants to proceed with testing.  If the out of pocket cost of testing is less than $100 she will be billed by the genetic testing laboratory.  We will make note for the laboratory to call her if there will be ANY out of pocket cost for Ms. Bland Span.   We discussed that some people do not want to undergo genetic testing due to fear of  genetic discrimination.  A federal law called the Genetic Information Non-Discrimination Act (GINA) of 2008 helps protect individuals against genetic discrimination based on their genetic test results.  It impacts both health insurance and employment.  For health insurance, it protects against increased premiums, being kicked off insurance or being forced to take a test in order to be insured.  For employment it protects against hiring, firing and promoting decisions based on genetic test results.  Health status due to a cancer diagnosis is not protected under GINA.  This law does not protect life insurance, disability insurance, or other types of insurance.   Based on the patient's personal and family history, the statistical model (Tyrer cusik)  was used to estimate her risk of developing breast cacner. This estimates her lifetime risk of developing breast cancer to be approximately 22.3%. This estimation is performed in the case that genetic testing is negative.  A positive result may impact this risk assessment.   The patient's lifetime breast cancer risk is a preliminary estimate based on available information using one of several models endorsed by the [place] (ACS). The ACS recommends consideration of breast MRI screening as an adjunct to mammography for patients at high risk (defined as 20% or greater lifetime risk). A more detailed breast cancer risk assessment can be considered, if clinically indicated.   Ms. Strubel has been determined to be at high risk for breast cancer.  Therefore, we recommend that annual screening with mammography and breast MRI begin at age 73, or 10 years prior to the age of breast cancer diagnosis in a relative (whichever is earlier).  We discussed that Ms. Goshorn should discuss her individual situation with her referring physician and determine a breast cancer screening plan with which they are both comfortable.      PLAN: After considering the risks,  benefits, and limitations, Ms. Nolting  provided informed consent to pursue genetic testing and the blood sample was sent to [facility] for analysis of the Common Hereditary Cancer Panel. Results should be available within approximately 2-3 weeks' time, at which point they will be disclosed by telephone to Ms. Hannon, as will any additional recommendations warranted by these results. Ms. Fitzwater will  receive a summary of her genetic counseling visit and a copy of her results once available. This information will also be available in Epic. We encouraged Ms. Perot to remain in contact with cancer genetics annually so that we can continuously update the family history and inform her of any changes in cancer genetics and testing that may be of benefit for her family. Ms. Ingle questions were answered to her satisfaction today. Our contact information was provided should additional questions or concerns arise.  Based on Ms. Vanwingerden's family history, her siblings and paternal relatives appear to meet medical criteria for genetic testing.  Ms. Podolak will let us know if we can be of any assistance in coordinating genetic counseling and/or testing for this family member.   Lastly, we encouraged Ms. Mackel to remain in contact with cancer genetics annually so that we can continuously update the family history and inform her of any changes in cancer genetics and testing that may be of benefit for this family.   Ms.  Kuipers questions were answered to her satisfaction today. Our contact information was provided should additional questions or concerns arise. Thank you for the referral and allowing [place] to share in the care of your patient.   Tana Felts, MS Genetic Counselor [email or web address] phone: [phone]  The patient was seen for a total of 40 minutes in face-to-face genetic counseling.  The patient was accompanied today by her mother, Laureen Abrahams. Genetic counseling intern, Donnetta Hail was  present and participated in part of the session under my supervision

## 2017-08-05 ENCOUNTER — Telehealth: Payer: Self-pay | Admitting: Genetics

## 2017-08-05 NOTE — Telephone Encounter (Signed)
Informed patient the lab has said if she has [employer] it will be $0 OOP cost for her.  However, they do need her insurance card to verify this for her.  I left my email and phone number for her.

## 2017-08-10 ENCOUNTER — Telehealth: Payer: Self-pay | Admitting: Genetics

## 2017-08-10 NOTE — Telephone Encounter (Signed)
Left message informing patient that the genetic testing laboratory needs her insurance card but that if it is medicaid that the OOP cost would be $0. I left her my phone number and email address.

## 2017-08-19 ENCOUNTER — Telehealth: Payer: Self-pay | Admitting: Genetics

## 2017-08-19 NOTE — Telephone Encounter (Signed)
Left message informing patient that the laboratory need a copy of her new insurance card in order to complete her genetic testing.  Provided my phone and email.

## 2017-09-08 ENCOUNTER — Telehealth: Payer: Self-pay | Admitting: Genetics

## 2017-10-15 ENCOUNTER — Telehealth: Payer: Self-pay | Admitting: Genetics

## 2017-10-15 NOTE — Telephone Encounter (Signed)
Left message for patient to call back to discuss genetic test results.

## 2017-10-15 NOTE — Telephone Encounter (Signed)
told patient to call me back to discuss test results and to contact lab regarding insurance/billin info.left appropriate phone numbers

## 2017-10-22 ENCOUNTER — Encounter: Payer: Self-pay | Admitting: Genetics

## 2017-10-22 ENCOUNTER — Ambulatory Visit: Payer: Self-pay | Admitting: Genetics

## 2017-10-22 DIAGNOSIS — Z800 Family history of malignant neoplasm of digestive organs: Secondary | ICD-10-CM

## 2017-10-22 DIAGNOSIS — Z1379 Encounter for other screening for genetic and chromosomal anomalies: Secondary | ICD-10-CM

## 2017-10-22 DIAGNOSIS — Z803 Family history of malignant neoplasm of breast: Secondary | ICD-10-CM

## 2017-10-22 NOTE — Progress Notes (Signed)
HPI:  Ms. Yetman was previously seen in the [place] clinic on 07/26/2017 due to a family history of cancer and concerns regarding a hereditary predisposition to cancer. Please refer to our prior cancer genetics clinic note for more information regarding Ms. Fredricksen's medical, social and family histories, and our assessment and recommendations, at the time. Ms. Bungert recent genetic test results were disclosed to her, as well as recommendations warranted by these results. Was not able to get in touch with patient to disclose until 10/15/2017. These results and recommendations are discussed in more detail below.  CANCER HISTORY:   No history exists.     FAMILY HISTORY:  We obtained a detailed, 4-generation family history.  Significant diagnoses are listed below: Family History  Problem Relation Age of Onset  . Breast cancer Maternal Grandmother 34  . Breast cancer Paternal Grandmother        age dx unk  . Cancer Father 16       lung cancer, hx smoking  . Thyroid disease Sister   . Hypertension Sister   . Breast cancer Mother 17       left breast, dx right breast cancer at 8  . Colon cancer Mother 32  . Breast cancer Other        5 aternal great aunts with breast cancer in 40's    Ms. Kempton has 2 daughters ages 40 and 71 and an 25 year-old son.  Ms. Macha has a twin brother with no children, a brother who is 58 with 3 children, and a sister who is 46 with a daughter.  None of her siblings or nieces/nephews have had a diagnosis of cancer.   Ms. Rossano father died at 74 due to lung cancer dx at 45.  Ms. Sulak has a paternal aunt whois 23 with no history  of cancer.  This aunt has fraternal twin daughters.  Ms. Rodenberg also has a paternal uncle who is 63 with no history of cancer- he has a daughter. Ms. Jons paternal grandfather is 54 with no histoyr of cancer.  Ms. Himmelberger paternal grandmother was diagnosed with breast cancer at an unknown age.  This grandmother had 5 sisters  (patient's great aunts) who develop breast cancer in their 63's.  One of these great aunts had a daughter (patient's 2nd cousin) with breast cancer in her 63's.    Ms. Budnick mother had left breast cancer at 45 and right breast cancer at 61.  Her mother also had colon cancer at 4.  Her mother has very recently had genetic testing on Jul 19, 2017 that was negative. VUS's in SDHB and VHL were identified in her mother's testing.   Her mother was tested for the Common Hereditary Cancer Panel (47 genes).  Ms. Weilbacher has 5 maternal uncles with no history of cancer and 4 maternal aunts with no history of cancer.  Ms. Hettinger maternal grandmother develped breast cancer in her 41's and died at 80.  Her maternal grandmother had a sister who had breast cancer in her 39's.    Patient's maternal ancestors are of African American/Native American/Irish descent, and paternal ancestors are of [place] and [place] descent. There is no reported Ashkenazi Jewish ancestry. There is no known consanguinity.  GENETIC TEST RESULTS: Genetic testing performed through [facility] reported out on 09/07/2017 showed no pathogenic mutations. The Common Hereditary Cancer Panel offered by [facility] includes sequencing and/or deletion duplication testing of the following 47 genes: APC, ATM,  AXIN2, BARD1, BMPR1A, BRCA1, BRCA2, BRIP1, CDH1, CDKN2A (p14ARF), CDKN2A (p16INK4a), CKD4, CHEK2, CTNNA1, DICER1, EPCAM (Deletion/duplication testing only), GREM1 (promoter region deletion/duplication testing only), KIT, MEN1, MLH1, MSH2, MSH3, MSH6, MUTYH, NBN, NF1, NHTL1, PALB2, PDGFRA, PMS2, POLD1, POLE, PTEN, RAD50, RAD51C, RAD51D, SDHB, SDHC, SDHD, SMAD4, SMARCA4. STK11, TP53, TSC1, TSC2, and VHL.  The following genes were evaluated for sequence changes only: SDHA and HOXB13 c.251G>A variant only..  A variant of uncertain significance (VUS) in a gene called SDHB was also noted. c.65G>C (p.Cys22Ser) A variant of uncertain  significance (VUS) in a gene called VHL was also noted. c.3G>A (Initiator codon)  The test report will be scanned into EPIC and will be located under the Molecular Pathology section of the Results Review tab. A portion of the result report is included below for reference.     We discussed with Ms. Meske that because current genetic testing is not perfect, it is possible there may be a gene mutation in one of these genes that current testing cannot detect, but that chance is small.  We also discussed, that there could be another gene that has not yet been discovered, or that we have not yet tested, that is responsible for the cancer diagnoses in the family. It is also possible there is a hereditary cause for the cancer in the family that Ms. Cottrell did not inherit and therefore was not identified in her testing.  Therefore, it is important to remain in touch with cancer genetics in the future so that we can continue to offer Ms. Folino the most up to date genetic testing.   Regarding the VUS's in SDHB and VHL: At this time, it is unknown if these variants are associated with increased cancer risk or if they are normal findings, but most variants such as these get reclassified to being inconsequential. They should not be used to make medical management decisions. With time, we suspect the lab will determine the significance of these variants, if any. If we do learn more about them, we will try to contact Ms. Catoe to discuss it further. However, it is important to stay in touch with us periodically and keep the address and phone number up to date.  ADDITIONAL GENETIC TESTING: We discussed with Ms. Maple that there are other genes that are associated with increased cancer risk that can be analyzed. The laboratories that offer this testing look at these additional genes via a hereditary cancer gene panel. Should Ms. Mousseau wish to pursue additional genetic testing, we are happy to discuss and coordinate this  testing, at any time.    CANCER SCREENING RECOMMENDATIONS: Ms. Leach's test result is considered negative (normal).  This means that we have not identified a hereditary cause for her family history of cancer at this time.   While reassuring, this does not definitively rule out a hereditary predisposition to cancer. It is still possible that there could be genetic mutations that are undetectable by current technology, or genetic mutations in genes that have not been tested or identified to increase cancer risk.  Therefore, it is recommended she continue to follow the cancer management and screening guidelines provided by her oncology and primary healthcare provider. An individual's cancer risk is not determined by genetic test results alone.  Overall cancer risk assessment includes additional factors such as personal medical history, family history, etc.  These should be used to make a personalized plan for cancer prevention and surveillance.     Based on her mother's history of   colon cancer, we recommend Ms. Lieb start having colonoscopies every 5 years starting by age 40 (or as directed by her doctors).    Based on the patient's personal and family history, the statistical model (Tyrer cusik)  was used to estimate her risk of developing breast cacner. This estimates her lifetime risk of developing breast cancer to be approximately 22.3%.   The patient's lifetime breast cancer risk is a preliminary estimate based on available information using one of several models endorsed by the American Cancer Society (ACS). The ACS recommends consideration of breast MRI screening as an adjunct to mammography for patients at high risk (defined as 20% or greater lifetime risk). A more detailed breast cancer risk assessment can be considered, if clinically indicated.   Ms. Majano has been determined to be at high risk for breast cancer. Therefore, we recommend that annual screening with mammography and breast MRI  begin at age 30, or 10 years prior to the age of breast cancer diagnosis in a relative (whichever is earlier). We discussed that Ms. Orwick should discuss her individual situation with her referring physician and determine a breast cancer screening plan with which they are both comfortable.     RECOMMENDATIONS FOR FAMILY MEMBERS:  Relatives in this family might be at some increased risk of developing cancer, over the general population risk, simply due to the family history of cancer.  We recommended women in this family have a yearly mammogram beginning at age 40, or 10 years younger than the earliest onset of cancer, an annual clinical breast exam, and perform monthly breast self-exams. Women in this family should also have a gynecological exam as recommended by their primary provider. All family members should have a colonoscopy by age 50 (or as directed by their doctors).  All family members should inform their physicians about the family history of cancer so their doctors can make the most appropriate screening recommendations for them.   It is also possible there is a hereditary cause for the cancer in Ms. Lucy's family that she did not inherit and therefore was not identified in her.   We recommended her paternal relatives also have have genetic counseling and testing. Ms. Longest will let us know if we can be of any assistance in coordinating genetic counseling and/or testing for these family members.   FOLLOW-UP: Lastly, we discussed with Ms. Wicke that cancer genetics is a rapidly advancing field and it is possible that new genetic tests will be appropriate for her and/or her family members in the future. We encouraged her to remain in contact with cancer genetics on an annual basis so we can update her personal and family histories and let her know of advances in cancer genetics that may benefit this family.   Our contact number was provided. Ms. Boursiquot's questions were answered to her  satisfaction, and she knows she is welcome to call us at anytime with additional questions or concerns.   Lindsay Smith, MS, CGC Certified Genetic Counselor [email or web address] 

## 2018-08-19 ENCOUNTER — Other Ambulatory Visit: Payer: Self-pay

## 2018-08-19 ENCOUNTER — Encounter (HOSPITAL_COMMUNITY): Payer: Self-pay | Admitting: *Deleted

## 2018-08-19 ENCOUNTER — Inpatient Hospital Stay (HOSPITAL_COMMUNITY)
Admission: AD | Admit: 2018-08-19 | Discharge: 2018-08-19 | Disposition: A | Discharge status: Home / Self Care | Payer: Medicaid Other | Source: Ambulatory Visit | Attending: Obstetrics & Gynecology | Admitting: Obstetrics & Gynecology

## 2018-08-19 DIAGNOSIS — N939 Abnormal uterine and vaginal bleeding, unspecified: Secondary | ICD-10-CM | POA: Insufficient documentation

## 2018-08-19 DIAGNOSIS — Z7982 Long term (current) use of aspirin: Secondary | ICD-10-CM | POA: Insufficient documentation

## 2018-08-19 DIAGNOSIS — Z3202 Encounter for pregnancy test, result negative: Secondary | ICD-10-CM

## 2018-08-19 DIAGNOSIS — N3000 Acute cystitis without hematuria: Secondary | ICD-10-CM

## 2018-08-19 LAB — CBC
HCT: 37 % (ref 36.0–46.0)
HEMOGLOBIN: 12 g/dL (ref 12.0–15.0)
MCH: 30.4 pg (ref 26.0–34.0)
MCHC: 32.4 g/dL (ref 30.0–36.0)
MCV: 93.7 fL (ref 80.0–100.0)
Platelets: 224 10*3/uL (ref 150–400)
RBC: 3.95 MIL/uL (ref 3.87–5.11)
RDW: 12.8 % (ref 11.5–15.5)
WBC: 3.7 10*3/uL — ABNORMAL LOW (ref 4.0–10.5)
nRBC: 0 % (ref 0.0–0.2)

## 2018-08-19 LAB — URINALYSIS, ROUTINE W REFLEX MICROSCOPIC
GLUCOSE, UA: NEGATIVE mg/dL
Ketones, ur: 15 mg/dL — AB
Nitrite: POSITIVE — AB
Protein, ur: >300 mg/dL — AB
pH: 7 (ref 5.0–8.0)

## 2018-08-19 LAB — HIV ANTIBODY (ROUTINE TESTING W REFLEX): HIV SCREEN 4TH GENERATION: NONREACTIVE

## 2018-08-19 LAB — WET PREP, GENITAL
Sperm: NONE SEEN
Trich, Wet Prep: NONE SEEN
Yeast Wet Prep HPF POC: NONE SEEN

## 2018-08-19 LAB — GC/CHLAMYDIA PROBE AMP (~~LOC~~) NOT AT ARMC
Chlamydia: NEGATIVE
Neisseria Gonorrhea: NEGATIVE

## 2018-08-19 LAB — URINALYSIS, MICROSCOPIC (REFLEX): RBC / HPF: >50 RBC/hpf (ref 0–5)

## 2018-08-19 LAB — HCG, QUANTITATIVE, PREGNANCY: hCG, Beta Chain, Quant, S: <1 m[IU]/mL (ref <5)

## 2018-08-19 LAB — POCT PREGNANCY, URINE: Preg Test, Ur: NEGATIVE

## 2018-08-19 MED ORDER — SULFAMETHOXAZOLE-TRIMETHOPRIM 800-160 MG PO TABS: 1.0000 | ORAL_TABLET | Freq: Two times a day (BID) | ORAL | 0 refills | Status: AC

## 2018-08-19 NOTE — MAU Note (Signed)
Was told she was pregnant yesterday.  Started having abdominal cramping after being seen at her Dr.  Vivi Martens bleeding started tonight-noticed when she woke up around 0230 this AM.  Passing some golf ball size clots.  Took 800 ibuprofen for the pain last at 1900.

## 2018-08-19 NOTE — MAU Provider Note (Signed)
Chief Complaint: Abdominal Pain and Vaginal Bleeding   First Provider Initiated Contact with Patient 08/19/18 0507      SUBJECTIVE HPI: Shirley Ponce is a 43 y.o. [id number] who presents to maternity admissions reporting positive pregnancy test on 08/17/18 at her PCP, a [facility] office in [place], [place].  She reports onset of cramping and vaginal bleeding with clots today. The bleeding is like heavy menstrual bleeding, soaking a pad in 2-3 hours. The cramping is low in her abdomen, intermittent cramping pain that radiates to her low back.  Her LMP was in January, she did not have a period in February, which was unusual.  She denies any hx of HTN, reports her BP usually runs low to normal.   She denies vaginal bleeding, vaginal itching/burning, urinary symptoms, h/a, dizziness, n/v, or fever/chills.     HPI  Past Medical History:  Diagnosis Date  . Anemia   . Anxiety   . Depression   . Family history of breast cancer   . Family history of colon cancer   . Headache(784.0)   . Migraine headache with aura   . Obesity (BMI 35.0-39.9 without comorbidity)    Past Surgical History:  Procedure Laterality Date  . CESAREAN SECTION  2010  . CHOLECYSTECTOMY    . DILATION AND CURETTAGE OF UTERUS    . WISDOM TOOTH EXTRACTION     Social History   Socioeconomic History  . Marital status: Single    Spouse name: Not on file  . Number of children: Not on file  . Years of education: Not on file  . Highest education level: Not on file  Occupational History  . Not on file  Social Needs  . Financial resource strain: Not on file  . Food insecurity:    Worry: Not on file    Inability: Not on file  . Transportation needs:    Medical: Not on file    Non-medical: Not on file  Tobacco Use  . Smoking status: Never Smoker  Substance and Sexual Activity  . Alcohol use: No  . Drug use: No  . Sexual activity: Yes    Birth control/protection: None  Lifestyle  . Physical activity:   Days per week: Not on file    Minutes per session: Not on file  . Stress: Not on file  Relationships  . Social connections:    Talks on phone: Not on file    Gets together: Not on file    Attends religious service: Not on file    Active member of club or organization: Not on file    Attends meetings of clubs or organizations: Not on file    Relationship status: Not on file  . Intimate partner violence:    Fear of current or ex partner: Not on file    Emotionally abused: Not on file    Physically abused: Not on file    Forced sexual activity: Not on file  Other Topics Concern  . Not on file  Social History Narrative  . Not on file   No current facility-administered medications on file prior to encounter.    Current Outpatient Medications on File Prior to Encounter  Medication Sig Dispense Refill  . ibuprofen (ADVIL,MOTRIN) 600 MG tablet Take 800 mg by mouth every 6 (six) hours as needed for mild pain.     . Multiple Vitamin (MULTIVITAMIN) tablet Take 1 tablet by mouth daily.    . SUMAtriptan (IMITREX) 100 MG tablet Take 100 mg  by mouth every 2 (two) hours as needed for migraine or headache. May repeat in 2 hours if headache persists or recurs.    Marland Kitchen aspirin-acetaminophen-caffeine (EXCEDRIN MIGRAINE) 250-250-65 MG per tablet Take 1 tablet by mouth every 6 (six) hours as needed for migraine.     . busPIRone (BUSPAR) 10 MG tablet Take 10 mg by mouth 3 (three) times daily.    . meclizine (ANTIVERT) 12.5 MG tablet Take 1 tablet (12.5 mg total) by mouth 3 (three) times daily as needed for dizziness. 30 tablet 0  . medroxyPROGESTERone (PROVERA) 10 MG tablet Take 1 tablet (10 mg total) by mouth daily. 10 tablet 0  . naproxen sodium (ANAPROX) 220 MG tablet Take 220 mg by mouth 2 (two) times daily with a meal.    . promethazine (PHENERGAN) 12.5 MG tablet Take 1 tablet (12.5 mg total) by mouth every 6 (six) hours as needed for nausea or vomiting. 30 tablet 0   Allergies  Allergen Reactions  .  Latex Hives  . Mushroom Extract Complex Swelling    ROS:  Review of Systems  Constitutional: Negative for chills, fatigue and fever.  Respiratory: Negative for shortness of breath.   Cardiovascular: Negative for chest pain.  Gastrointestinal: Positive for abdominal pain. Negative for nausea and vomiting.  Genitourinary: Positive for pelvic pain and vaginal bleeding. Negative for difficulty urinating, dysuria, flank pain, vaginal discharge and vaginal pain.  Neurological: Negative for dizziness and headaches.  Psychiatric/Behavioral: Negative.      I have reviewed patient's Past Medical Hx, Surgical Hx, Family Hx, Social Hx, medications and allergies.   Physical Exam   Patient Vitals for the past 24 hrs:  BP Temp Pulse Resp SpO2 Height Weight  08/19/18 0446 (!) 146/100 - 79 - - - -  08/19/18 0443 (!) 160/99 - 86 - - - -  08/19/18 0346 (!) 153/105 98.3 F (36.8 C) 92 17 100 % 5\' 8"  (1.727 m) 102.7 kg   Constitutional: Well-developed, well-nourished female in no acute distress.  Cardiovascular: normal rate Respiratory: normal effort GI: Abd soft, non-tender. Pos BS x 4 MS: Extremities nontender, no edema, normal ROM Neurologic: Alert and oriented x 4.  GU: Neg CVAT.  PELVIC EXAM: Cervix pink, visually closed, without lesion, small amount dark red bleeding requiring one fox swab to visualize cervix, vaginal walls and external genitalia normal Bimanual exam: Cervix 0/long/high, firm, anterior, neg CMT, uterus tender, nonenlarged, adnexa without tenderness, enlargement, or mass   LAB RESULTS Results for orders placed or performed during the hospital encounter of 08/19/18 (from the past 24 hour(s))  Pregnancy, urine POC     Status: None   Collection Time: 08/19/18  4:02 AM  Result Value Ref Range   Preg Test, Ur NEGATIVE NEGATIVE  Urinalysis, Routine w reflex microscopic     Status: Abnormal   Collection Time: 08/19/18  4:22 AM  Result Value Ref Range   Color, Urine RED (A)  YELLOW   APPearance TURBID (A) CLEAR   Specific Gravity, Urine <1.005 (L) 1.005 - 1.030   pH 7.0 5.0 - 8.0   Glucose, UA NEGATIVE NEGATIVE mg/dL   Hgb urine dipstick LARGE (A) NEGATIVE   Bilirubin Urine SMALL (A) NEGATIVE   Ketones, ur 15 (A) NEGATIVE mg/dL   Protein, ur >300 (A) NEGATIVE mg/dL   Nitrite POSITIVE (A) NEGATIVE   Leukocytes,Ua MODERATE (A) NEGATIVE  Urinalysis, Microscopic (reflex)     Status: Abnormal   Collection Time: 08/19/18  4:22 AM  Result Value Ref Range  RBC / HPF >50 0 - 5 RBC/hpf   WBC, UA 6-10 0 - 5 WBC/hpf   Bacteria, UA RARE (A) NONE SEEN   Squamous Epithelial / LPF 0-5 0 - 5   Mucus PRESENT   CBC     Status: Abnormal   Collection Time: 08/19/18  5:14 AM  Result Value Ref Range   WBC 3.7 (L) 4.0 - 10.5 K/uL   RBC 3.95 3.87 - 5.11 MIL/uL   Hemoglobin 12.0 12.0 - 15.0 g/dL   HCT 37.0 36.0 - 46.0 %   MCV 93.7 80.0 - 100.0 fL   MCH 30.4 26.0 - 34.0 pg   MCHC 32.4 30.0 - 36.0 g/dL   RDW 12.8 11.5 - 15.5 %   Platelets 224 150 - 400 K/uL   nRBC 0.0 0.0 - 0.2 %  hCG, quantitative, pregnancy     Status: None   Collection Time: 08/19/18  5:14 AM  Result Value Ref Range   hCG, Beta Chain, Quant, S <1 <5 mIU/mL  Wet prep, genital     Status: Abnormal   Collection Time: 08/19/18  5:28 AM  Result Value Ref Range   Yeast Wet Prep HPF POC NONE SEEN NONE SEEN   Trich, Wet Prep NONE SEEN NONE SEEN   Clue Cells Wet Prep HPF POC PRESENT (A) NONE SEEN   WBC, Wet Prep HPF POC MANY (A) NONE SEEN   Sperm NONE SEEN        IMAGING No results found.  MAU Management/MDM: Orders Placed This Encounter  Procedures  . Wet prep, genital  . Urinalysis, Routine w reflex microscopic  . CBC  . hCG, quantitative, pregnancy  . HIV Antibody (routine testing w rflx)  . Urinalysis, Microscopic (reflex)  . Pregnancy, urine POC  . Discharge patient    No orders of the defined types were placed in this encounter.   Pregnancy test in MAU is negative, but pt does have  record of her [facility] pregnancy test less than 24 hours ago that is positive.  Labs, including hcg ordered and pt remained in MAU for results.  hcg <1. Pelvic exam benign.  Likely false positive test in office yesterday. Discussed with pt.  UA with nitrites so will treat for UTI with Bactrim DS, 3 day course.  Pt to f/u with OB/Gyn as desired and with PCP as planned. Return to MAU as needed for emergencies.    ASSESSMENT 1. Negative pregnancy test   2. Abnormal uterine bleeding (AUB)     PLAN Discharge home Allergies as of 08/19/2018      Reactions   Latex Hives   Mushroom Extract Complex Swelling      Medication List    STOP taking these medications   busPIRone 10 MG tablet Commonly known as:  BUSPAR   meclizine 12.5 MG tablet Commonly known as:  ANTIVERT   medroxyPROGESTERone 10 MG tablet Commonly known as:  PROVERA   naproxen sodium 220 MG tablet Commonly known as:  ALEVE   promethazine 12.5 MG tablet Commonly known as:  PHENERGAN     TAKE these medications   Excedrin Migraine 250-250-65 MG tablet Generic drug:  aspirin-acetaminophen-caffeine Take 1 tablet by mouth every 6 (six) hours as needed for migraine.   ibuprofen 600 MG tablet Commonly known as:  ADVIL,MOTRIN Take 800 mg by mouth every 6 (six) hours as needed for mild pain.   multivitamin tablet Take 1 tablet by mouth daily.   SUMAtriptan 100 MG tablet Commonly known as:  IMITREX Take 100 mg by mouth every 2 (two) hours as needed for migraine or headache. May repeat in 2 hours if headache persists or recurs.      Follow-up Information    OB/Gyn of your choice Follow up.   Why:  See list provided       Your primary care provider Follow up.   Why:  As scheduled          Fatima Blank Certified Nurse-Midwife 08/19/2018  6:46 AM

## 2018-12-24 ENCOUNTER — Encounter (HOSPITAL_COMMUNITY): Payer: Self-pay | Admitting: Emergency Medicine

## 2018-12-24 ENCOUNTER — Emergency Department (HOSPITAL_COMMUNITY)
Admission: EM | Admit: 2018-12-24 | Discharge: 2018-12-24 | Disposition: A | Discharge status: Home / Self Care | Payer: Medicaid Other | Attending: Emergency Medicine | Admitting: Emergency Medicine

## 2018-12-24 ENCOUNTER — Other Ambulatory Visit: Payer: Self-pay

## 2018-12-24 DIAGNOSIS — Z7982 Long term (current) use of aspirin: Secondary | ICD-10-CM | POA: Diagnosis not present

## 2018-12-24 DIAGNOSIS — G43909 Migraine, unspecified, not intractable, without status migrainosus: Secondary | ICD-10-CM | POA: Diagnosis not present

## 2018-12-24 DIAGNOSIS — G43009 Migraine without aura, not intractable, without status migrainosus: Secondary | ICD-10-CM

## 2018-12-24 MED ORDER — KETOROLAC TROMETHAMINE 30 MG/ML IJ SOLN
30.0000 mg | Freq: Once | INTRAMUSCULAR | Status: AC
  Administered 2018-12-24: 06:00:00 30 mg via INTRAVENOUS
  Filled 2018-12-24: qty 1

## 2018-12-24 MED ORDER — METOCLOPRAMIDE HCL 5 MG/ML IJ SOLN
10.0000 mg | Freq: Once | INTRAMUSCULAR | Status: AC
  Administered 2018-12-24: 06:00:00 10 mg via INTRAVENOUS
  Filled 2018-12-24: qty 2

## 2018-12-24 MED ORDER — DIPHENHYDRAMINE HCL 50 MG/ML IJ SOLN
25.0000 mg | Freq: Once | INTRAMUSCULAR | Status: AC
  Administered 2018-12-24: 06:00:00 25 mg via INTRAVENOUS
  Filled 2018-12-24: qty 1

## 2018-12-24 MED ORDER — DEXAMETHASONE SODIUM PHOSPHATE 10 MG/ML IJ SOLN
10.0000 mg | Freq: Once | INTRAMUSCULAR | Status: AC
  Administered 2018-12-24: 10 mg via INTRAVENOUS
  Filled 2018-12-24: qty 1

## 2018-12-24 MED ORDER — SODIUM CHLORIDE 0.9 % IV BOLUS
1000.0000 mL | Freq: Once | INTRAVENOUS | Status: AC
  Administered 2018-12-24: 06:00:00 1000 mL via INTRAVENOUS

## 2018-12-24 NOTE — Discharge Instructions (Addendum)
Follow-up with your primary care doctor and have them referred to neurology as planned to help keep your migraines controlled

## 2018-12-24 NOTE — ED Provider Notes (Signed)
  Physical Exam  BP (!) 133/97   Pulse 78   Resp 18   Ht 5\' 8"  (1.727 m)   Wt 98.4 kg   LMP 11/29/2018 (Exact Date)   SpO2 98%   BMI 32.99 kg/m   Physical Exam  ED Course/Procedures     Procedures  MDM  Patient feels much better after treatment.  States that her primary care doctor has been managing her headaches but she is going to have her primary care doctor refer her to a neurologist.  Will discharge home.       Davonna Belling, MD 12/24/18 [phone]

## 2018-12-24 NOTE — ED Triage Notes (Signed)
Pt reports a migraine going on for 3 days. Pt reports a hx of same and has taken her immetrex and ibuprofen as directed without relief.

## 2018-12-24 NOTE — ED Notes (Signed)
Patient verbalizes understanding of discharge instructions . Opportunity for questions and answers were provided . Armband removed by staff ,Pt discharged from ED. W/C  offered at D/C  and Declined W/C at D/C and was escorted to lobby by RN.  

## 2018-12-24 NOTE — ED Provider Notes (Signed)
[facility] EMERGENCY DEPARTMENT Provider Note   CSN: [id number] Arrival date & time: 12/24/18  0548    History   Chief Complaint Chief Complaint  Patient presents with  . Migraine    HPI Shirley Ponce is a 43 y.o. female.   The history is provided by the patient.  Migraine  She has history of anxiety, depression, migraines and comes in complaining of a right hemicranial headache for the last 3 days typical of her migraines.  She describes it as if somebody was banging on her head with a hammer.  Pain is rated at 10/10.  There is associated photophobia and phonophobia.  She has had nausea and vomiting.  She has been taking Imitrex and ibuprofen which normally help her headache, but are not helping this time.  She denies fever, chills, sweats.  She denies stiff neck.  She denies weakness, numbness, tingling.  She denies any visual change.  Past Medical History:  Diagnosis Date  . Anemia   . Anxiety   . Depression   . Family history of breast cancer   . Family history of colon cancer   . Headache(784.0)   . Migraine headache with aura   . Obesity (BMI 35.0-39.9 without comorbidity)     Patient Active Problem List   Diagnosis Date Noted  . Genetic testing 10/22/2017  . Family history of breast cancer   . Family history of colon cancer   . Breast pain, right 11/22/2012    Past Surgical History:  Procedure Laterality Date  . CESAREAN SECTION  2010  . CHOLECYSTECTOMY    . DILATION AND CURETTAGE OF UTERUS    . WISDOM TOOTH EXTRACTION       OB History    Gravida  4   Para  3   Term  3   Preterm      AB  1   Living  3     SAB  1   TAB      Ectopic      Multiple      Live Births               Home Medications    Prior to Admission medications   Medication Sig Start Date End Date Taking? Authorizing Provider  aspirin-acetaminophen-caffeine (EXCEDRIN MIGRAINE) [phone] MG per tablet Take 1 tablet by mouth every 6 (six) hours  as needed for migraine.     [provider]  ibuprofen (ADVIL,MOTRIN) 600 MG tablet Take 800 mg by mouth every 6 (six) hours as needed for mild pain.     [provider]  Multiple Vitamin (MULTIVITAMIN) tablet Take 1 tablet by mouth daily.    [provider]  SUMAtriptan (IMITREX) 100 MG tablet Take 100 mg by mouth every 2 (two) hours as needed for migraine or headache. May repeat in 2 hours if headache persists or recurs.    [provider]    Family History Family History  Problem Relation Age of Onset  . Breast cancer Maternal Grandmother 7  . Breast cancer Paternal Grandmother        age dx unk  . Cancer Father 58       lung cancer, hx smoking  . Thyroid disease Sister   . Hypertension Sister   . Breast cancer Mother 78       left breast, dx right breast cancer at 16  . Colon cancer Mother 38  . Breast cancer Other  5 aternal great aunts with breast cancer in 60's    Social History Social History   Tobacco Use  . Smoking status: Never Smoker  . Smokeless tobacco: Never Used  Substance Use Topics  . Alcohol use: No  . Drug use: No     Allergies   Latex and Mushroom extract complex   Review of Systems Review of Systems  All other systems reviewed and are negative.    Physical Exam Updated Vital Signs BP (!) 143/105   Pulse 85   Resp 18   Ht 5\' 8"  (1.727 m)   Wt 98.4 kg   LMP 11/29/2018 (Exact Date)   SpO2 100%   BMI 32.99 kg/m   Physical Exam Vitals signs and nursing note reviewed.    43 year old female, resting comfortably and in no acute distress. Vital signs are significant for elevated blood pressure. Oxygen saturation is 100%, which is normal. Head is normocephalic and atraumatic. PERRLA, EOMI. Oropharynx is clear.  Fundi show no hemorrhage, exudate, papilledema. Neck is nontender and supple without adenopathy or JVD. Back is nontender and there is no CVA tenderness. Lungs are clear without rales,  wheezes, or rhonchi. Chest is nontender. Heart has regular rate and rhythm without murmur. Abdomen is soft, flat, nontender without masses or hepatosplenomegaly and peristalsis is normoactive. Extremities have no cyanosis or edema, full range of motion is present. Skin is warm and dry without rash. Neurologic: Mental status is normal, cranial nerves are intact, there are no motor or sensory deficits.  ED Treatments / Results   Procedures Procedures  Medications Ordered in ED Medications  sodium chloride 0.9 % bolus 1,000 mL (has no administration in time range)  ketorolac (TORADOL) 30 MG/ML injection 30 mg (has no administration in time range)  metoCLOPramide (REGLAN) injection 10 mg (has no administration in time range)  diphenhydrAMINE (BENADRYL) injection 25 mg (has no administration in time range)  dexamethasone (DECADRON) injection 10 mg (has no administration in time range)     Initial Impression / Assessment and Plan / ED Course  I have reviewed the triage vital signs and the nursing notes.  Right hemicranial headache typical of patient's migraine headaches.  No red flags to suggest more serious pathology such as subarachnoid hemorrhage or meningitis.  Old records are reviewed, and last ED visit for migraines was in 2015.  She will be given IV fluids, ketorolac, metoclopramide, diphenhydramine, dexamethasone.  Patient is sleeping following above-noted medication.  Case is signed out to Dr. Alvino Chapel.  Final Clinical Impressions(s) / ED Diagnoses   Final diagnoses:  Migraine without aura and without status migrainosus, not intractable    ED Discharge Orders    None       Delora Fuel, MD 89/37/34 [phone]

## 2019-03-13 ENCOUNTER — Emergency Department (HOSPITAL_COMMUNITY)
Admission: EM | Admit: 2019-03-13 | Discharge: 2019-03-14 | Disposition: A | Discharge status: Home / Self Care | Payer: Medicaid Other | Attending: Emergency Medicine | Admitting: Emergency Medicine

## 2019-03-13 ENCOUNTER — Encounter (HOSPITAL_COMMUNITY): Payer: Self-pay

## 2019-03-13 ENCOUNTER — Emergency Department (HOSPITAL_COMMUNITY): Payer: Medicaid Other

## 2019-03-13 ENCOUNTER — Other Ambulatory Visit: Payer: Self-pay

## 2019-03-13 DIAGNOSIS — E876 Hypokalemia: Secondary | ICD-10-CM | POA: Diagnosis not present

## 2019-03-13 DIAGNOSIS — Z91040 Latex allergy status: Secondary | ICD-10-CM | POA: Diagnosis not present

## 2019-03-13 DIAGNOSIS — R002 Palpitations: Secondary | ICD-10-CM

## 2019-03-13 DIAGNOSIS — R0602 Shortness of breath: Secondary | ICD-10-CM | POA: Diagnosis not present

## 2019-03-13 DIAGNOSIS — R079 Chest pain, unspecified: Secondary | ICD-10-CM | POA: Diagnosis present

## 2019-03-13 LAB — BASIC METABOLIC PANEL
Anion gap: 8 (ref 5–15)
BUN: 7 mg/dL (ref 6–20)
CO2: 27 mmol/L (ref 22–32)
Calcium: 9.2 mg/dL (ref 8.9–10.3)
Chloride: 103 mmol/L (ref 98–111)
Creatinine, Ser: 1 mg/dL (ref 0.44–1.00)
GFR calc Af Amer: >60 mL/min (ref >60)
GFR calc non Af Amer: >60 mL/min (ref >60)
Glucose, Bld: 99 mg/dL (ref 70–99)
Potassium: 3.2 mmol/L — ABNORMAL LOW (ref 3.5–5.1)
Sodium: 138 mmol/L (ref 135–145)

## 2019-03-13 LAB — CBC
HCT: 39.3 % (ref 36.0–46.0)
Hemoglobin: 12.6 g/dL (ref 12.0–15.0)
MCH: 30.7 pg (ref 26.0–34.0)
MCHC: 32.1 g/dL (ref 30.0–36.0)
MCV: 95.6 fL (ref 80.0–100.0)
Platelets: 265 10*3/uL (ref 150–400)
RBC: 4.11 MIL/uL (ref 3.87–5.11)
RDW: 12.9 % (ref 11.5–15.5)
WBC: 5.4 10*3/uL (ref 4.0–10.5)
nRBC: 0 % (ref 0.0–0.2)

## 2019-03-13 LAB — TROPONIN I (HIGH SENSITIVITY): Troponin I (High Sensitivity): 4 ng/L (ref <18)

## 2019-03-13 LAB — I-STAT BETA HCG BLOOD, ED (MC, WL, AP ONLY): I-stat hCG, quantitative: <5 m[IU]/mL (ref <5)

## 2019-03-13 MED ORDER — SODIUM CHLORIDE 0.9% FLUSH: 3.0000 mL | Freq: Once | INTRAVENOUS | Status: DC

## 2019-03-13 NOTE — ED Triage Notes (Signed)
Pt states that she has been having CP and palpations for the past several days, saw her PCP today. Pt reports that she recently started new medication. Some SOB, dizziness, nausea

## 2019-03-14 LAB — TROPONIN I (HIGH SENSITIVITY): Troponin I (High Sensitivity): 5 ng/L (ref <18)

## 2019-03-14 MED ORDER — POTASSIUM CHLORIDE CRYS ER 20 MEQ PO TBCR
40.0000 meq | EXTENDED_RELEASE_TABLET | Freq: Once | ORAL | Status: AC
  Administered 2019-03-14: 40 meq via ORAL
  Filled 2019-03-14: qty 2

## 2019-03-14 MED ORDER — POTASSIUM CHLORIDE CRYS ER 20 MEQ PO TBCR: 20.0000 meq | EXTENDED_RELEASE_TABLET | Freq: Every day | ORAL | 0 refills | Status: DC

## 2019-03-14 NOTE — Discharge Instructions (Signed)
If you develop recurrent, continued, or worsening chest pain, shortness of breath, fever, vomiting, abdominal or back pain, or any other new/concerning symptoms then return to the ER for evaluation.   Call your doctor for outpatient follow-up.  They may be able to provide a Holter monitor which might capture what is causing her palpitations.

## 2019-03-14 NOTE — ED Provider Notes (Signed)
[facility] EMERGENCY DEPARTMENT Provider Note   CSN: [id number] Arrival date & time: 03/13/19  2059     History   Chief Complaint Chief Complaint  Patient presents with  . Chest Pain    HPI Shirley Ponce is a 43 y.o. female.    HPI  43 year old female presents with chest pain and palpitations.  She is been having palpitations for about a week.  Was recently started on hydrochlorothiazide for leg swelling by her PCP about 2 weeks ago.  She states that the chest pain started last night around 6 PM.  Felt like a tightness and squeezing.  She felt like it went into her arm and she had a little bit of shortness of breath.  However no vomiting.  Went away on its own at some point while in the waiting room.  No leg swelling currently.  She notes that she is also taking a large amount of vitamin supplements.  Past Medical History:  Diagnosis Date  . Anemia   . Anxiety   . Depression   . Family history of breast cancer   . Family history of colon cancer   . Headache(784.0)   . Migraine headache with aura   . Obesity (BMI 35.0-39.9 without comorbidity)     Patient Active Problem List   Diagnosis Date Noted  . Genetic testing 10/22/2017  . Family history of breast cancer   . Family history of colon cancer   . Breast pain, right 11/22/2012    Past Surgical History:  Procedure Laterality Date  . CESAREAN SECTION  2010  . CHOLECYSTECTOMY    . DILATION AND CURETTAGE OF UTERUS    . WISDOM TOOTH EXTRACTION       OB History    Gravida  4   Para  3   Term  3   Preterm      AB  1   Living  3     SAB  1   TAB      Ectopic      Multiple      Live Births               Home Medications    Prior to Admission medications   Medication Sig Start Date End Date Taking? Authorizing Provider  aspirin-acetaminophen-caffeine (EXCEDRIN MIGRAINE) [phone] MG per tablet Take 1 tablet by mouth every 6 (six) hours as needed for migraine.     [provider]  ibuprofen (ADVIL,MOTRIN) 600 MG tablet Take 800 mg by mouth every 6 (six) hours as needed for mild pain.     [provider]  Multiple Vitamin (MULTIVITAMIN) tablet Take 1 tablet by mouth daily.    [provider]  potassium chloride SA (KLOR-CON) 20 MEQ tablet Take 1 tablet (20 mEq total) by mouth daily. 03/14/19   Sherwood Gambler, MD  SUMAtriptan (IMITREX) 100 MG tablet Take 100 mg by mouth every 2 (two) hours as needed for migraine or headache. May repeat in 2 hours if headache persists or recurs.    [provider]    Family History Family History  Problem Relation Age of Onset  . Breast cancer Maternal Grandmother 61  . Breast cancer Paternal Grandmother        age dx unk  . Cancer Father 57       lung cancer, hx smoking  . Thyroid disease Sister   . Hypertension Sister   . Breast cancer Mother 41  left breast, dx right breast cancer at 28  . Colon cancer Mother 77  . Breast cancer Other        5 aternal great aunts with breast cancer in 44's    Social History Social History   Tobacco Use  . Smoking status: Never Smoker  . Smokeless tobacco: Never Used  Substance Use Topics  . Alcohol use: No  . Drug use: No     Allergies   Latex and Mushroom extract complex   Review of Systems Review of Systems  Respiratory: Positive for shortness of breath.   Cardiovascular: Positive for chest pain and palpitations. Negative for leg swelling.  Gastrointestinal: Positive for nausea. Negative for abdominal pain and vomiting.  Musculoskeletal: Negative for back pain.  All other systems reviewed and are negative.    Physical Exam Updated Vital Signs BP (!) 133/92 (BP Location: Right Arm)   Pulse 68   Temp 98.2 F (36.8 C) (Oral)   Resp 16   LMP 02/14/2019   SpO2 100%   Physical Exam Vitals signs and nursing note reviewed.  Constitutional:      General: She is not in acute distress.    Appearance: She is well-developed.  She is obese. She is not ill-appearing or diaphoretic.  HENT:     Head: Normocephalic and atraumatic.     Right Ear: External ear normal.     Left Ear: External ear normal.     Nose: Nose normal.  Eyes:     General:        Right eye: No discharge.        Left eye: No discharge.  Cardiovascular:     Rate and Rhythm: Normal rate and regular rhythm.     Heart sounds: Normal heart sounds.  Pulmonary:     Effort: Pulmonary effort is normal.     Breath sounds: Normal breath sounds.  Abdominal:     Palpations: Abdomen is soft.     Tenderness: There is no abdominal tenderness.  Skin:    General: Skin is warm and dry.  Neurological:     Mental Status: She is alert.  Psychiatric:        Mood and Affect: Mood is not anxious.      ED Treatments / Results  Labs (all labs ordered are listed, but only abnormal results are displayed) Labs Reviewed  BASIC METABOLIC PANEL - Abnormal; Notable for the following components:      Result Value   Potassium 3.2 (*)    All other components within normal limits  CBC  I-STAT BETA HCG BLOOD, ED ([facility], [facility], [facility] ONLY)  TROPONIN I (HIGH SENSITIVITY)  TROPONIN I (HIGH SENSITIVITY)    EKG EKG Interpretation  Date/Time:  Tuesday March 14 2019 08:12:00 EDT Ventricular Rate:  78 PR Interval:    QRS Duration: 89 QT Interval:  395 QTC Calculation: 450 R Axis:   12 Text Interpretation:  Sinus rhythm RSR' in V1 or V2, right VCD or RVH no acute ST/T changes similar to 2015 Confirmed by Sherwood Gambler [phone]) on 03/14/2019 8:17:32 AM   Radiology Dg Chest 2 View  Result Date: 03/13/2019 CLINICAL DATA:  Chest pain nausea EXAM: CHEST - 2 VIEW COMPARISON:  None. FINDINGS: The heart size and mediastinal contours are within normal limits. Both lungs are clear. The visualized skeletal structures are unremarkable. Surgical clips in the right upper quadrant. IMPRESSION: No active cardiopulmonary disease. Electronically Signed   By: Donavan Foil M.D.   On:  03/13/2019 21:45    Procedures Procedures (including critical care time)  Medications Ordered in ED Medications  sodium chloride flush (NS) 0.9 % injection 3 mL (has no administration in time range)  potassium chloride SA (KLOR-CON) CR tablet 40 mEq (has no administration in time range)     Initial Impression / Assessment and Plan / ED Course  I have reviewed the triage vital signs and the nursing notes.  Pertinent labs & imaging results that were available during my care of the patient were reviewed by me and considered in my medical decision making (see chart for details).     HEAR Score: 2  Patient's work-up is overall pretty benign.  She does have some mild hypokalemia which will be repleted and likely is due to the HCTZ.  Could be causing some palpitations though it is not particularly low.  I will supplement this.  Otherwise from a cardiac standpoint I have low suspicion of ACS and with benign appearing ECG and troponin is negative x2 I think she can go home for outpatient follow-up.  Suspicion for PE and dissection are low.  As for the palpitations, no arrhythmia noted here on cardiac monitoring and I discussed it would probably be prudent to follow-up with her PCP for outpatient Holter monitoring.  Return precautions.  Final Clinical Impressions(s) / ED Diagnoses   Final diagnoses:  Nonspecific chest pain  Palpitations  Hypokalemia    ED Discharge Orders         Ordered    potassium chloride SA (KLOR-CON) 20 MEQ tablet  Daily     03/14/19 0819           Sherwood Gambler, MD 03/14/19 0825

## 2019-03-14 NOTE — ED Notes (Signed)
Patient verbalizes understanding of discharge instructions. Opportunity for questioning and answers were provided. Armband removed by staff, pt discharged from ED.  

## 2019-03-22 ENCOUNTER — Other Ambulatory Visit: Payer: Self-pay

## 2019-03-22 DIAGNOSIS — Z20822 Contact with and (suspected) exposure to covid-19: Secondary | ICD-10-CM

## 2019-03-23 LAB — NOVEL CORONAVIRUS, NAA: SARS-CoV-2, NAA: NOT DETECTED

## 2019-04-27 ENCOUNTER — Inpatient Hospital Stay (HOSPITAL_COMMUNITY)
Admission: AD | Admit: 2019-04-27 | Discharge: 2019-04-27 | Disposition: A | Discharge status: Home / Self Care | Payer: Medicaid Other | Attending: Obstetrics & Gynecology | Admitting: Obstetrics & Gynecology

## 2019-04-27 ENCOUNTER — Other Ambulatory Visit: Payer: Self-pay

## 2019-04-27 DIAGNOSIS — R102 Pelvic and perineal pain: Secondary | ICD-10-CM | POA: Diagnosis present

## 2019-04-27 DIAGNOSIS — N898 Other specified noninflammatory disorders of vagina: Secondary | ICD-10-CM

## 2019-04-27 DIAGNOSIS — R1031 Right lower quadrant pain: Secondary | ICD-10-CM | POA: Diagnosis not present

## 2019-04-27 LAB — POCT PREGNANCY, URINE: Preg Test, Ur: NEGATIVE

## 2019-04-27 LAB — URINALYSIS, ROUTINE W REFLEX MICROSCOPIC
Bilirubin Urine: NEGATIVE
Glucose, UA: NEGATIVE mg/dL
Hgb urine dipstick: NEGATIVE
Ketones, ur: NEGATIVE mg/dL
Nitrite: NEGATIVE
Protein, ur: 30 mg/dL — AB
Specific Gravity, Urine: 1.021 (ref 1.005–1.030)
pH: 6 (ref 5.0–8.0)

## 2019-04-27 NOTE — MAU Note (Signed)
Pt states she will go to [facility] for further evaluation of abd pain. She had bad experience in [facility] ED on previous visit.

## 2019-04-27 NOTE — MAU Note (Signed)
Hansel Feinstein CNM in to see pt in Triage

## 2019-04-27 NOTE — MAU Provider Note (Signed)
Chief Complaint:  Pelvic Pain   HPI: BERA DWELLE is a 43 y.o. [id number] who presents to maternity admissions reporting Right lower quadrant pain.  Also has discharge. States she is not pregnant.  Did not know we didn't see nonpregnant women.      Physical Exam   Patient Vitals for the past 24 hrs:  BP Temp Pulse Resp Height Weight  04/27/19 0502 125/85 98.5 F (36.9 C) 72 18 5' 8.5" (1.74 m) 101.2 kg   Constitutional: Well-developed, well-nourished female in no acute distress.  C/O RLQ pain. In no obvious distress.    Plan: Discussed we do not see nonpregnant women here in this unit Reviewed options for care May keep appt at [facility] for Tuesday or go to Urgent care or one of three Emergency Departments States will go to [facility]  Hansel Feinstein CNM, MSN Certified Nurse-Midwife 04/27/2019 5:32 AM

## 2019-04-27 NOTE — MAU Note (Signed)
Having severe pelvic pain on RLQ for 4 days. Periods have been irregular last several months. Bleeding is heavy with spotting inbetween. LMP 04/13/19. Has appt with [facility] on Tuesday

## 2019-04-28 ENCOUNTER — Encounter (HOSPITAL_BASED_OUTPATIENT_CLINIC_OR_DEPARTMENT_OTHER): Payer: Self-pay | Admitting: Emergency Medicine

## 2019-04-28 ENCOUNTER — Ambulatory Visit (HOSPITAL_BASED_OUTPATIENT_CLINIC_OR_DEPARTMENT_OTHER): Admit: 2019-04-28 | Payer: Medicaid Other

## 2019-04-28 ENCOUNTER — Emergency Department (HOSPITAL_BASED_OUTPATIENT_CLINIC_OR_DEPARTMENT_OTHER)
Admission: EM | Admit: 2019-04-28 | Discharge: 2019-04-28 | Disposition: A | Discharge status: Home / Self Care | Payer: Medicaid Other | Attending: Emergency Medicine | Admitting: Emergency Medicine

## 2019-04-28 ENCOUNTER — Other Ambulatory Visit: Payer: Self-pay

## 2019-04-28 DIAGNOSIS — N760 Acute vaginitis: Secondary | ICD-10-CM | POA: Diagnosis not present

## 2019-04-28 DIAGNOSIS — Z91018 Allergy to other foods: Secondary | ICD-10-CM | POA: Insufficient documentation

## 2019-04-28 DIAGNOSIS — R102 Pelvic and perineal pain: Secondary | ICD-10-CM

## 2019-04-28 DIAGNOSIS — R1031 Right lower quadrant pain: Secondary | ICD-10-CM | POA: Diagnosis not present

## 2019-04-28 DIAGNOSIS — B9689 Other specified bacterial agents as the cause of diseases classified elsewhere: Secondary | ICD-10-CM

## 2019-04-28 DIAGNOSIS — Z7982 Long term (current) use of aspirin: Secondary | ICD-10-CM | POA: Insufficient documentation

## 2019-04-28 DIAGNOSIS — Z91040 Latex allergy status: Secondary | ICD-10-CM | POA: Insufficient documentation

## 2019-04-28 LAB — URINALYSIS, ROUTINE W REFLEX MICROSCOPIC
Bilirubin Urine: NEGATIVE
Glucose, UA: NEGATIVE mg/dL
Hgb urine dipstick: NEGATIVE
Ketones, ur: NEGATIVE mg/dL
Leukocytes,Ua: NEGATIVE
Nitrite: NEGATIVE
Protein, ur: NEGATIVE mg/dL
Specific Gravity, Urine: 1.02 (ref 1.005–1.030)
pH: 6.5 (ref 5.0–8.0)

## 2019-04-28 LAB — WET PREP, GENITAL
Sperm: NONE SEEN
Trich, Wet Prep: NONE SEEN
Yeast Wet Prep HPF POC: NONE SEEN

## 2019-04-28 LAB — PREGNANCY, URINE: Preg Test, Ur: NEGATIVE

## 2019-04-28 MED ORDER — METRONIDAZOLE 500 MG PO TABS: 500.0000 mg | ORAL_TABLET | Freq: Two times a day (BID) | ORAL | 0 refills | Status: DC

## 2019-04-28 NOTE — ED Provider Notes (Signed)
[place] DEPT [facility] Provider Note: Georgena Spurling, MD, FACEP  CSN: [id number] MRN: [id number] ARRIVAL: 04/28/19 at [place]: [place]  Abdominal Pain   HISTORY OF PRESENT ILLNESS  04/28/19 2:06 AM Shirley Ponce is a 43 y.o. female with a 2-week history of right lower quadrant pelvic pain which has worsened over the past 4 days.  She denies vaginal bleeding currently but has had a vaginal discharge.  The pain is sharp and worse with movement or ambulation.  She rates her pain as a 9 out of 10.  She denies associated nausea, vomiting or diarrhea.  She has had burning with urination.   Past Medical History:  Diagnosis Date  . Anemia   . Anxiety   . Depression   . Family history of breast cancer   . Family history of colon cancer   . Headache(784.0)   . Migraine headache with aura   . Obesity (BMI 35.0-39.9 without comorbidity)     Past Surgical History:  Procedure Laterality Date  . CESAREAN SECTION  2010  . CHOLECYSTECTOMY    . DILATION AND CURETTAGE OF UTERUS    . WISDOM TOOTH EXTRACTION      Family History  Problem Relation Age of Onset  . Breast cancer Maternal Grandmother 60  . Breast cancer Paternal Grandmother        age dx unk  . Cancer Father 18       lung cancer, hx smoking  . Thyroid disease Sister   . Hypertension Sister   . Breast cancer Mother 47       left breast, dx right breast cancer at 86  . Colon cancer Mother 4  . Breast cancer Other        5 aternal great aunts with breast cancer in 56's    Social History   Tobacco Use  . Smoking status: Never Smoker  . Smokeless tobacco: Never Used  Substance Use Topics  . Alcohol use: No  . Drug use: No    Prior to Admission medications   Medication Sig Start Date End Date Taking? Authorizing Provider  aspirin-acetaminophen-caffeine (EXCEDRIN MIGRAINE) [phone] MG per tablet Take 1 tablet by mouth every 6 (six) hours as needed for migraine.     [provider]   ibuprofen (ADVIL,MOTRIN) 600 MG tablet Take 800 mg by mouth every 6 (six) hours as needed for mild pain.     [provider]  metroNIDAZOLE (FLAGYL) 500 MG tablet Take 1 tablet (500 mg total) by mouth 2 (two) times daily. One po bid x 7 days 04/28/19   Adrianna Dudas, MD  Multiple Vitamin (MULTIVITAMIN) tablet Take 1 tablet by mouth daily.    [provider]  potassium chloride SA (KLOR-CON) 20 MEQ tablet Take 1 tablet (20 mEq total) by mouth daily. 03/14/19   Sherwood Gambler, MD  SUMAtriptan (IMITREX) 100 MG tablet Take 100 mg by mouth every 2 (two) hours as needed for migraine or headache. May repeat in 2 hours if headache persists or recurs.    [provider]    Allergies Latex and Mushroom extract complex   REVIEW OF SYSTEMS  Negative except as noted here or in the History of Present Illness.   PHYSICAL EXAMINATION  Initial Vital Signs Blood pressure 130/88, pulse 72, temperature 98.5 F (36.9 C), temperature source Oral, resp. rate 18, height 5' 8.5" (1.74 m), weight 99.8 kg, last menstrual period 04/13/2019, SpO2 100 %.  Examination General: Well-developed, well-nourished  female in no acute distress; appearance consistent with age of record HENT: normocephalic; atraumatic Eyes: pupils equal, round and reactive to light; extraocular muscles intact Neck: supple Heart: regular rate and rhythm Lungs: clear to auscultation bilaterally Abdomen: soft; nondistended; right lower quadrant tenderness; bowel sounds present GU: Normal external genitalia; no vaginal bleeding; white vaginal discharge; no cervical motion tenderness; right adnexal tenderness Extremities: No deformity; full range of motion; pulses normal Neurologic: Awake, alert and oriented; motor function intact in all extremities and symmetric; no facial droop Skin: Warm and dry Psychiatric: Normal mood and affect   RESULTS  Summary of this visit's results, reviewed and interpreted by myself:    EKG Interpretation  Date/Time:    Ventricular Rate:    PR Interval:    QRS Duration:   QT Interval:    QTC Calculation:   R Axis:     Text Interpretation:        Laboratory Studies: Results for orders placed or performed during the hospital encounter of 04/28/19 (from the past 24 hour(s))  Urinalysis, Routine w reflex microscopic     Status: None   Collection Time: 04/28/19  2:01 AM  Result Value Ref Range   Color, Urine YELLOW YELLOW   APPearance CLEAR CLEAR   Specific Gravity, Urine 1.020 1.005 - 1.030   pH 6.5 5.0 - 8.0   Glucose, UA NEGATIVE NEGATIVE mg/dL   Hgb urine dipstick NEGATIVE NEGATIVE   Bilirubin Urine NEGATIVE NEGATIVE   Ketones, ur NEGATIVE NEGATIVE mg/dL   Protein, ur NEGATIVE NEGATIVE mg/dL   Nitrite NEGATIVE NEGATIVE   Leukocytes,Ua NEGATIVE NEGATIVE  Pregnancy, urine     Status: None   Collection Time: 04/28/19  2:01 AM  Result Value Ref Range   Preg Test, Ur NEGATIVE NEGATIVE  Wet prep, genital     Status: Abnormal   Collection Time: 04/28/19  2:19 AM   Specimen: Vaginal  Result Value Ref Range   Yeast Wet Prep HPF POC NONE SEEN NONE SEEN   Trich, Wet Prep NONE SEEN NONE SEEN   Clue Cells Wet Prep HPF POC PRESENT (A) NONE SEEN   WBC, Wet Prep HPF POC MANY (A) NONE SEEN   Sperm NONE SEEN    Imaging Studies: No results found.  ED COURSE and MDM  Nursing notes, initial and subsequent vitals signs, including pulse oximetry, reviewed and interpreted by myself.  Vitals:   04/28/19 0155 04/28/19 0158  BP:  130/88  Pulse:  72  Resp:  18  Temp:  98.5 F (36.9 C)  TempSrc:  Oral  SpO2:  100%  Weight: 99.8 kg   Height: 5' 8.5" (1.74 m)    Medications - No data to display  2:47 AM Presence of clue cells with history of discharge is consistent with bacterial vaginosis and we will treat her with Flagyl.  We will have her return for an ultrasound to evaluate her right adnexal pain.  I suspect an ovarian cyst.  PROCEDURES  Procedures   ED  DIAGNOSES     ICD-10-CM   1. Pelvic pain in female  R10.2   2. BV (bacterial vaginosis)  N76.0    B96.89        Leasha Goldberger, Jenny Reichmann, MD 04/28/19 [phone]

## 2019-04-28 NOTE — ED Triage Notes (Signed)
Pt c/o pelvic pain x 2 weeks getting progressively worse. Denies n/v/d. Reports vaginal discharge.

## 2019-04-29 LAB — URINE CULTURE: Culture: <10000 — AB

## 2019-05-01 LAB — GC/CHLAMYDIA PROBE AMP (~~LOC~~) NOT AT ARMC
Chlamydia: NEGATIVE
Neisseria Gonorrhea: NEGATIVE

## 2019-05-02 ENCOUNTER — Other Ambulatory Visit: Payer: Self-pay

## 2019-05-02 ENCOUNTER — Encounter: Payer: Self-pay | Admitting: Obstetrics and Gynecology

## 2019-05-02 ENCOUNTER — Other Ambulatory Visit (HOSPITAL_COMMUNITY)
Admission: RE | Admit: 2019-05-02 | Discharge: 2019-05-02 | Disposition: A | Discharge status: Home / Self Care | Payer: Medicaid Other | Source: Ambulatory Visit | Attending: Obstetrics and Gynecology | Admitting: Obstetrics and Gynecology

## 2019-05-02 ENCOUNTER — Ambulatory Visit: Payer: Medicaid Other | Admitting: Obstetrics and Gynecology

## 2019-05-02 DIAGNOSIS — R87613 High grade squamous intraepithelial lesion on cytologic smear of cervix (HGSIL): Secondary | ICD-10-CM | POA: Diagnosis not present

## 2019-05-02 DIAGNOSIS — Z3202 Encounter for pregnancy test, result negative: Secondary | ICD-10-CM | POA: Diagnosis not present

## 2019-05-02 DIAGNOSIS — N871 Moderate cervical dysplasia: Secondary | ICD-10-CM

## 2019-05-02 LAB — POCT URINE PREGNANCY: Preg Test, Ur: NEGATIVE

## 2019-05-02 NOTE — Progress Notes (Signed)
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  Placed in lithotomy position. Cervix viewed with speculum and colposcope after application of acetic acid.   Colposcopy adequate?  yes Acetowhite lesions? Yes 12, 3 and 6 o'clock Punctation? no Mosaicism?  no Abnormal vasculature?  no Biopsies? 12, 3 and 6 o'clock ECC? yes  COMMENTS:  Patient was given post procedure instructions.  She will return in 2 weeks for results and LEEP  Mora Bellman, MD

## 2019-05-02 NOTE — Progress Notes (Signed)
Pt is here for follow up from abnormal pap smear. Pt is here for colposcopy.

## 2019-05-02 NOTE — Patient Instructions (Signed)
Loop Electrosurgical Excision Procedure Loop electrosurgical excision procedure (LEEP) is the cutting and removal (excision) of tissue from the cervix. The cervix is the bottom part of the uterus that opens into the vagina. The tissue that is removed from the cervix is examined to see if there are precancerous cells or cancer cells present. LEEP may be done when:  You have abnormal bleeding from your cervix.  You have an abnormal Pap test result.  Your health care provider finds an abnormality on your cervix during a pelvic exam. LEEP typically only takes a few minutes and is often done in the health care provider's office. The procedure is safe for women who are trying to get pregnant. However, the procedure is usually not done during a menstrual period or during pregnancy. Tell a health care provider about:  Any allergies you have.  All medicines you are taking, including vitamins, herbs, eye drops, creams, and over-the-counter medicines.  Any blood disorders you have.  Any medical conditions you have, including current or past vaginal infections such as herpes or sexually-transmitted infections (STIs).  Whether you are pregnant or may be pregnant.  Whether or not you are having vaginal bleeding on the day of the procedure. What are the risks? Generally, this is a safe procedure. However, problems may occur, including:  Infection.  Bleeding.  Allergic reactions to medicines.  Changes or scarring in the cervix.  Increased risk of early (preterm) labor in future pregnancies. What happens before the procedure?  Ask your health care provider about: ? Changing or stopping your regular medicines. This is especially important if you are taking diabetes medicines or blood thinners. ? Taking medicines such as aspirin and ibuprofen. These medicines can thin your blood. Do not take these medicines unless your health care provider tells you to take them. ? Taking over-the-counter  medicines, vitamins, herbs, and supplements.  Your health care provider may recommend that you take pain medicine before the procedure.  Ask your health care provider if you should plan to have someone take you home after the procedure. What happens during the procedure?   An instrument called a speculum will be placed in your vagina. This will allow your health care provider to see your cervix.  You will be given a medicine to numb the area (local anesthetic). The medicine will be injected into your cervix and the surrounding area.  A solution will be applied to your cervix. This solution will help the health care provider find the abnormal cells that need to be removed.  A thin wire loop will be passed through your vagina. The wire will be used to burn (cauterize) the cervical tissue with an electrical current.  You may feel faint during the procedure. Tell your health care provider right away if you feel this way.  The abnormal cervical tissue will be removed.  Any open blood vessels will be cauterized to prevent bleeding.  A paste may be applied to the cauterized area of your cervix to help prevent bleeding.  The sample of cervical tissue will be examined under a microscope. The procedure may vary among health care providers and hospitals. What can I expect after the procedure? After the procedure, it is common to have:  Mild abdominal cramps that are similar to menstrual cramps. These may last for up to 1 week.  A small amount of pink-tinged or bloody vaginal discharge, including light to moderate bleeding, for 1-2 weeks.  A dark-colored discharge coming from your vagina. This is from   the paste that was used on the cervix to prevent bleeding. It is up to you to get the results of your procedure. Ask your health care provider, or the department that is doing the procedure, when your results will be ready. Follow these instructions at home:  Take over-the-counter and  prescription medicines only as told by your health care provider.  Return to your normal activities as told by your health care provider. Ask your health care provider what activities are safe for you.  Do not put anything in your vagina for 2 weeks after the procedure or until your health care provider says that it is okay. This includes tampons, creams, and douches.  Do not have sex until your health care provider approves.  Keep all follow-up visits as told by your health care provider. This is important. Contact a health care provider if you:  Have a fever or chills.  Feel unusually weak.  Have vaginal bleeding that is heavier or longer than a normal menstrual cycle. A sign of this can be soaking a pad with blood or bleeding with clots.  Develop a bad smelling vaginal discharge.  Have severe abdominal pain or cramping. Summary  Loop electrosurgical excision procedure (LEEP) is the removal of tissue from the cervix. The removed tissue will be checked for precancerous cells or cancer cells.  LEEP typically only takes a few minutes and is often done in the health care provider's office.  Do not put anything in your vagina for 2 weeks after the procedure or until your health care provider says that it is okay. This includes tampons, creams, and douches.  Keep all follow-up visits as told by your health care provider. Ask your health care provider, or the department that is doing the procedure, when your results will be ready. This information is not intended to replace advice given to you by your health care provider. Make sure you discuss any questions you have with your health care provider. Document Released: 08/15/2002 Document Revised: 06/17/2018 Document Reviewed: 06/17/2018 Elsevier Patient Education  2020 [employer].

## 2019-05-05 LAB — SURGICAL PATHOLOGY

## 2019-05-11 ENCOUNTER — Other Ambulatory Visit: Payer: Self-pay

## 2019-05-29 ENCOUNTER — Ambulatory Visit: Payer: Medicaid Other | Admitting: Obstetrics and Gynecology

## 2019-05-29 ENCOUNTER — Other Ambulatory Visit (HOSPITAL_COMMUNITY)
Admission: RE | Admit: 2019-05-29 | Discharge: 2019-05-29 | Disposition: A | Discharge status: Home / Self Care | Payer: Medicaid Other | Source: Ambulatory Visit | Attending: Obstetrics and Gynecology | Admitting: Obstetrics and Gynecology

## 2019-05-29 ENCOUNTER — Other Ambulatory Visit: Payer: Self-pay

## 2019-05-29 ENCOUNTER — Encounter: Payer: Self-pay | Admitting: Obstetrics and Gynecology

## 2019-05-29 VITALS — BP 118/75 | HR 120 | Wt 226.6 lb

## 2019-05-29 DIAGNOSIS — N871 Moderate cervical dysplasia: Secondary | ICD-10-CM | POA: Diagnosis not present

## 2019-05-29 DIAGNOSIS — R87613 High grade squamous intraepithelial lesion on cytologic smear of cervix (HGSIL): Secondary | ICD-10-CM | POA: Diagnosis present

## 2019-05-29 DIAGNOSIS — Z3202 Encounter for pregnancy test, result negative: Secondary | ICD-10-CM | POA: Diagnosis not present

## 2019-05-29 LAB — POCT URINE PREGNANCY: Preg Test, Ur: NEGATIVE

## 2019-05-29 NOTE — Addendum Note (Signed)
Addended by: Cleotilde Neer on: 05/29/2019 05:01 PM   Modules accepted: Orders

## 2019-05-29 NOTE — Progress Notes (Signed)
Patient identified, informed consent obtained, signed copy in chart, time out performed.  Pap smear and colposcopy reviewed.   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  Cervix visualized. Paracervical block placed.  large size LOOP used to remove cone of cervix using blend of cut and cautery on LEEP machine.  Edges/Base cauterized with Ball with good hemostasis.  Patient tolerated procedure well.  Patient given post procedure instructions.  Follow up in 6-12 months for repeat pap pending results or as needed.

## 2019-06-01 ENCOUNTER — Other Ambulatory Visit: Payer: Self-pay

## 2019-06-01 ENCOUNTER — Encounter (HOSPITAL_BASED_OUTPATIENT_CLINIC_OR_DEPARTMENT_OTHER): Payer: Self-pay | Admitting: Emergency Medicine

## 2019-06-01 ENCOUNTER — Emergency Department (HOSPITAL_BASED_OUTPATIENT_CLINIC_OR_DEPARTMENT_OTHER)
Admission: EM | Admit: 2019-06-01 | Discharge: 2019-06-01 | Disposition: A | Discharge status: Home / Self Care | Payer: Medicaid Other | Attending: Emergency Medicine | Admitting: Emergency Medicine

## 2019-06-01 DIAGNOSIS — Z9889 Other specified postprocedural states: Secondary | ICD-10-CM

## 2019-06-01 DIAGNOSIS — Z91040 Latex allergy status: Secondary | ICD-10-CM | POA: Insufficient documentation

## 2019-06-01 DIAGNOSIS — E876 Hypokalemia: Secondary | ICD-10-CM

## 2019-06-01 DIAGNOSIS — N938 Other specified abnormal uterine and vaginal bleeding: Secondary | ICD-10-CM | POA: Insufficient documentation

## 2019-06-01 DIAGNOSIS — N939 Abnormal uterine and vaginal bleeding, unspecified: Secondary | ICD-10-CM

## 2019-06-01 DIAGNOSIS — Z79899 Other long term (current) drug therapy: Secondary | ICD-10-CM | POA: Insufficient documentation

## 2019-06-01 LAB — CBC WITH DIFFERENTIAL/PLATELET
Abs Immature Granulocytes: 0.01 10*3/uL (ref 0.00–0.07)
Basophils Absolute: 0 10*3/uL (ref 0.0–0.1)
Basophils Relative: 1 %
Eosinophils Absolute: 0.1 10*3/uL (ref 0.0–0.5)
Eosinophils Relative: 3 %
HCT: 36.5 % (ref 36.0–46.0)
Hemoglobin: 11.8 g/dL — ABNORMAL LOW (ref 12.0–15.0)
Immature Granulocytes: 0 %
Lymphocytes Relative: 32 %
Lymphs Abs: 1.5 10*3/uL (ref 0.7–4.0)
MCH: 30.8 pg (ref 26.0–34.0)
MCHC: 32.3 g/dL (ref 30.0–36.0)
MCV: 95.3 fL (ref 80.0–100.0)
Monocytes Absolute: 0.5 10*3/uL (ref 0.1–1.0)
Monocytes Relative: 10 %
Neutro Abs: 2.6 10*3/uL (ref 1.7–7.7)
Neutrophils Relative %: 54 %
Platelets: 235 10*3/uL (ref 150–400)
RBC: 3.83 MIL/uL — ABNORMAL LOW (ref 3.87–5.11)
RDW: 13.6 % (ref 11.5–15.5)
WBC: 4.8 10*3/uL (ref 4.0–10.5)
nRBC: 0 % (ref 0.0–0.2)

## 2019-06-01 LAB — BASIC METABOLIC PANEL
Anion gap: 7 (ref 5–15)
BUN: 7 mg/dL (ref 6–20)
CO2: 24 mmol/L (ref 22–32)
Calcium: 9 mg/dL (ref 8.9–10.3)
Chloride: 107 mmol/L (ref 98–111)
Creatinine, Ser: 0.88 mg/dL (ref 0.44–1.00)
GFR calc Af Amer: >60 mL/min (ref >60)
GFR calc non Af Amer: >60 mL/min (ref >60)
Glucose, Bld: 94 mg/dL (ref 70–99)
Potassium: 3.3 mmol/L — ABNORMAL LOW (ref 3.5–5.1)
Sodium: 138 mmol/L (ref 135–145)

## 2019-06-01 LAB — SURGICAL PATHOLOGY

## 2019-06-01 MED ORDER — OXYCODONE-ACETAMINOPHEN 5-325 MG PO TABS: 1.0000 | ORAL_TABLET | ORAL | 0 refills | Status: DC | PRN

## 2019-06-01 MED ORDER — POTASSIUM CHLORIDE CRYS ER 20 MEQ PO TBCR
40.0000 meq | EXTENDED_RELEASE_TABLET | Freq: Once | ORAL | Status: AC
  Administered 2019-06-01: 40 meq via ORAL
  Filled 2019-06-01: qty 2

## 2019-06-01 MED ORDER — MORPHINE SULFATE (PF) 4 MG/ML IV SOLN
4.0000 mg | Freq: Once | INTRAVENOUS | Status: AC
  Administered 2019-06-01: 4 mg via INTRAVENOUS
  Filled 2019-06-01: qty 1

## 2019-06-01 MED ORDER — ONDANSETRON HCL 4 MG PO TABS: 4.0000 mg | ORAL_TABLET | Freq: Four times a day (QID) | ORAL | 0 refills | Status: DC | PRN

## 2019-06-01 MED ORDER — ONDANSETRON HCL 4 MG/2ML IJ SOLN
4.0000 mg | Freq: Once | INTRAMUSCULAR | Status: AC
  Administered 2019-06-01: 4 mg via INTRAVENOUS
  Filled 2019-06-01: qty 2

## 2019-06-01 NOTE — ED Triage Notes (Signed)
Patient states she had a LEEP procedure 3 days ago; states abd pain and cramping onset 1 day ago with bright red vaginal bleeding noted.

## 2019-06-01 NOTE — ED Provider Notes (Signed)
[place] EMERGENCY DEPARTMENT Provider Note   CSN: [id number] Arrival date & time: 06/01/19  0515   History Chief Complaint  Patient presents with  . Abdominal Pain  . Vaginal Bleeding    Shirley Ponce is a 43 y.o. female.  The history is provided by the patient.  Abdominal Pain Associated symptoms: vaginal bleeding   Vaginal Bleeding Associated symptoms: abdominal pain   She is 3 days status post LEEP procedure and developed bright red vaginal bleeding and pelvic pain at about 10 PM.  She had been having the normal, expected, mild vaginal bleeding following the procedure until 10PM.  Since then, she has saturated 5 pads.  Pelvic pain is severe and she rates it at 10/10.  There is no radiation of pain.  There is mild nausea but no vomiting.  She denies fever, chills, sweats.  She took acetaminophen without relief.  She applied a heating pad which did give slight relief.  Nothing seems to make the pain worse.  She is not taking aspirin or anticoagulants.   Past Medical History:  Diagnosis Date  . Anemia   . Anxiety   . Depression   . Family history of breast cancer   . Family history of colon cancer   . Headache(784.0)   . Migraine headache with aura   . Obesity (BMI 35.0-39.9 without comorbidity)     Patient Active Problem List   Diagnosis Date Noted  . 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 Genetic testing 10/22/2017  . Family history of breast cancer   . Family history of colon cancer   . Breast pain, right 11/22/2012    Past Surgical History:  Procedure Laterality Date  . CESAREAN SECTION  2010  . CHOLECYSTECTOMY    . DILATION AND CURETTAGE OF UTERUS    . WISDOM TOOTH EXTRACTION       OB History    Gravida  4   Para  3   Term  3   Preterm      AB  1   Living  3     SAB  1   TAB      Ectopic      Multiple      Live Births              Family History  Problem Relation Age of Onset  . Breast cancer Maternal  Grandmother 81  . Breast cancer Paternal Grandmother        age dx unk  . Cancer Father 72       lung cancer, hx smoking  . Thyroid disease Sister   . Hypertension Sister   . Breast cancer Mother 25       left breast, dx right breast cancer at 24  . Colon cancer Mother 87  . Breast cancer Other        5 aternal great aunts with breast cancer in 79's    Social History   Tobacco Use  . Smoking status: Never Smoker  . Smokeless tobacco: Never Used  Substance Use Topics  . Alcohol use: No  . Drug use: No    Home Medications Prior to Admission medications   Medication Sig Start Date End Date Taking? Authorizing Provider  aspirin-acetaminophen-caffeine (EXCEDRIN MIGRAINE) [phone] MG per tablet Take 1 tablet by mouth every 6 (six) hours as needed for migraine.     [provider]  busPIRone (BUSPAR) 5 MG tablet Take 1 tablet up to  twice a day as needed for stress. 04/26/19   [provider]  hydrochlorothiazide (HYDRODIURIL) 25 MG tablet Take by mouth. 04/10/19   [provider]  ibuprofen (ADVIL,MOTRIN) 600 MG tablet Take 800 mg by mouth every 6 (six) hours as needed for mild pain.     [provider]  metroNIDAZOLE (FLAGYL) 500 MG tablet Take 1 tablet (500 mg total) by mouth 2 (two) times daily. One po bid x 7 days Patient not taking: Reported on 05/02/2019 04/28/19   Molpus, John, MD  Multiple Vitamin (MULTIVITAMIN) tablet Take 1 tablet by mouth daily.    [provider]  potassium chloride SA (KLOR-CON) 20 MEQ tablet Take 1 tablet (20 mEq total) by mouth daily. 03/14/19   Sherwood Gambler, MD  SUMAtriptan (IMITREX) 100 MG tablet Take 100 mg by mouth every 2 (two) hours as needed for migraine or headache. May repeat in 2 hours if headache persists or recurs.    [provider]  thiamine (VITAMIN B-1) 100 MG tablet Take 100 mg by mouth daily.    [provider]    Allergies    Latex and Mushroom extract  complex  Review of Systems   Review of Systems  Gastrointestinal: Positive for abdominal pain.  Genitourinary: Positive for vaginal bleeding.  All other systems reviewed and are negative.   Physical Exam Updated Vital Signs BP 122/74 (BP Location: Right Arm)   Pulse 76   Temp 98.2 F (36.8 C) (Oral)   Resp 18   Ht 5\' 8"  (1.727 m)   Wt 102 kg   LMP 05/09/2019 (Exact Date)   SpO2 98%   BMI 34.19 kg/m   Physical Exam Vitals and nursing note reviewed.    43 year old female, appears uncomfortable, but is in no acute distress. Vital signs are normal. Oxygen saturation is 98%, which is normal. Head is normocephalic and atraumatic. PERRLA, EOMI. Oropharynx is clear. Neck is nontender and supple without adenopathy or JVD. Back is nontender and there is no CVA tenderness. Lungs are clear without rales, wheezes, or rhonchi. Chest is nontender. Heart has regular rate and rhythm without murmur. Abdomen is soft, flat, with mild suprapubic tenderness.  There is no rebound or guarding.  There are no masses or hepatosplenomegaly and peristalsis is hypoactive. Pelvic: Normal external female genitalia.  Moderate amount of dark red blood present in the vaginal vault.  Blood was removed with use of Fox swabs, and cervix appeared to be granulating appropriately with no active bleeding. Extremities have no cyanosis or edema, full range of motion is present. Skin is warm and dry without rash. Neurologic: Mental status is normal, cranial nerves are intact, there are no motor or sensory deficits.  ED Results / Procedures / Treatments   Labs (all labs ordered are listed, but only abnormal results are displayed) Labs Reviewed  CBC WITH DIFFERENTIAL/PLATELET - Abnormal; Notable for the following components:      Result Value   RBC 3.83 (*)    Hemoglobin 11.8 (*)    All other components within normal limits  BASIC METABOLIC PANEL - Abnormal; Notable for the following components:   Potassium 3.3  (*)    All other components within normal limits    Procedures Procedures   Medications Ordered in ED Medications  potassium chloride SA (KLOR-CON) CR tablet 40 mEq (has no administration in time range)  morphine 4 MG/ML injection 4 mg (4 mg Intravenous Given 06/01/19 0554)  ondansetron (ZOFRAN) injection 4 mg (4 mg  Intravenous Given 06/01/19 0551)    ED Course  I have reviewed the triage vital signs and the nursing notes.  Pertinent lab results that were available during my care of the patient were reviewed by me and considered in my medical decision making (see chart for details).  Vaginal bleeding 3 days post LEEP procedure.  Screening labs are obtained, and will need to do pelvic exam to look for bleeding site.  Old records are reviewed confirming LEEP procedure done on December 21.  Pelvic exam shows bleeding apparently has stopped.  Blood has turned from bright red to dark red and no evidence of ongoing bleeding.  Labs do show mild hypokalemia, and slight drop of hemoglobin compared to prior to her LEEP procedure.  She is given a dose of oral potassium.  She will be discharged with prescription for small number of oxycodone-acetaminophen tablets and instructed to follow-up with her gynecologist.  Return precautions discussed.  Record on the [place] controlled substance reporting website was reviewed, and it has been over 1 year since her last narcotic prescription.  MDM Rules/Calculators/A&P  Final Clinical Impression(s) / ED Diagnoses Final diagnoses:  Vaginal bleeding  Status post LEEP (loop electrosurgical excision procedure) of cervix  Hypokalemia    Rx / DC Orders ED Discharge Orders         Ordered    oxyCODONE-acetaminophen (PERCOCET) 5-325 MG tablet  Every 4 hours PRN     06/01/19 0625    ondansetron (ZOFRAN) 4 MG tablet  Every 6 hours PRN     06/01/19 [id number]           Delora Fuel, MD [id number] [phone]

## 2019-06-01 NOTE — Discharge Instructions (Signed)
Return if bleeding gets worse, or if pain is not being adequately controlled at home.

## 2019-11-30 ENCOUNTER — Encounter (HOSPITAL_COMMUNITY): Payer: Self-pay

## 2019-11-30 ENCOUNTER — Other Ambulatory Visit: Payer: Self-pay

## 2019-11-30 ENCOUNTER — Ambulatory Visit (HOSPITAL_COMMUNITY)
Admission: EM | Admit: 2019-11-30 | Discharge: 2019-11-30 | Disposition: A | Discharge status: Home / Self Care | Payer: Medicaid Other | Attending: Family Medicine | Admitting: Family Medicine

## 2019-11-30 DIAGNOSIS — J029 Acute pharyngitis, unspecified: Secondary | ICD-10-CM

## 2019-11-30 DIAGNOSIS — Z20822 Contact with and (suspected) exposure to covid-19: Secondary | ICD-10-CM | POA: Insufficient documentation

## 2019-11-30 DIAGNOSIS — Z3202 Encounter for pregnancy test, result negative: Secondary | ICD-10-CM | POA: Diagnosis not present

## 2019-11-30 DIAGNOSIS — R05 Cough: Secondary | ICD-10-CM | POA: Diagnosis not present

## 2019-11-30 DIAGNOSIS — R059 Cough, unspecified: Secondary | ICD-10-CM

## 2019-11-30 LAB — POC URINE PREG, ED: Preg Test, Ur: NEGATIVE

## 2019-11-30 LAB — POCT RAPID STREP A: Streptococcus, Group A Screen (Direct): NEGATIVE

## 2019-11-30 MED ORDER — HYDROCODONE-HOMATROPINE 5-1.5 MG/5ML PO SYRP: 5.0000 mL | ORAL_SOLUTION | Freq: Four times a day (QID) | ORAL | 0 refills | Status: DC | PRN

## 2019-11-30 NOTE — ED Triage Notes (Signed)
Pt reports sore throat since lat night and fever 101.0 F. Pt taking Tylenol and ibuprofen. Pt had a COVID test done today, results are pending.

## 2019-11-30 NOTE — Discharge Instructions (Addendum)
You may use over the counter ibuprofen or acetaminophen as needed.  For a sore throat, over the counter products such as [employer] Peroxyl Mouth Sore Rinse or Chloraseptic Sore Throat Spray may provide some temporary relief. Your rapid strep test was negative today. We have sent your throat swab for culture and will let you know of any positive results.  You have been tested for COVID-19 today. If your test returns positive, you will receive a phone call from [facility] regarding your results. Negative test results are not called. Both positive and negative results area always visible on MyChart. If you do not have a MyChart account, sign up instructions are provided in your discharge papers. Please do not hesitate to contact us should you have questions or concerns.  

## 2019-12-01 LAB — SARS CORONAVIRUS 2 (TAT 6-24 HRS): SARS Coronavirus 2: NEGATIVE

## 2019-12-03 LAB — CULTURE, GROUP A STREP (THRC)

## 2019-12-05 NOTE — ED Provider Notes (Signed)
Secretary   [id number] 11/30/19 Arrival Time: 4098  ASSESSMENT & PLAN:  1. Sore throat   2. Cough     No signs of peritonsillar abscess. Discussed. COVID testing sent. Rapid strep neg; culture sent. Suspect viral illness.  Meds ordered this encounter  Medications   HYDROcodone-homatropine (HYCODAN) 5-1.5 MG/5ML syrup    Sig: Take 5 mLs by mouth every 6 (six) hours as needed for cough.    Dispense:  90 mL    Refill:  0     Labs Reviewed  CULTURE, GROUP A STREP ([place])  SARS CORONAVIRUS 2 (TAT 6-24 HRS)  POC URINE PREG, ED  POCT RAPID STREP A    OTC analgesics and throat care as needed  [place] Controlled Substances Registry consulted for this patient. I feel the risk/benefit ratio today is favorable for proceeding with this prescription for a controlled substance. Medication sedation precautions given.     Discharge Instructions      You may use over the counter ibuprofen or acetaminophen as needed.   For a sore throat, over the counter products such as [employer] Peroxyl Mouth Sore Rinse or Chloraseptic Sore Throat Spray may provide some temporary relief.  Your rapid strep test was negative today. We have sent your throat swab for culture and will let you know of any positive results.  You have been tested for COVID-19 today. If your test returns positive, you will receive a phone call from [facility] regarding your results. Negative test results are not called. Both positive and negative results area always visible on MyChart. If you do not have a MyChart account, sign up instructions are provided in your discharge papers. Please do not hesitate to contact us should you have questions or concerns.       Reviewed expectations re: course of current medical issues. Questions answered. Outlined signs and symptoms indicating need for more acute intervention. Patient verbalized understanding. After Visit Summary given.   SUBJECTIVE:  Shirley Ponce is a  44 y.o. female who reports a sore throat. Describes as pain with swallowing. Onset abrupt beginning yesterday. Symptoms have progressed to a point and plateaued since beginning; without voice changes. With dry cough. Normal PO intake but reports discomfort with swallowing. No specific alleviating factors. Fever: present, low grade, 100-101. No neck pain or swelling. No associated nausea, vomiting, or abdominal pain. Known sick contacts: none. Recent travel: none. OTC treatment: Tylenol without much relief.    OBJECTIVE:  Vitals:   11/30/19 1811  BP: (!) 138/92  Pulse: 85  Resp: 18  Temp: 98.2 F (36.8 C)  TempSrc: Oral  SpO2: 100%     General appearance: alert; no distress HEENT: throat with cobblestoning; uvula is midline Neck: supple with FROM; no lymphadenopathy Lungs: speaks full sentences without difficulty; unlabored; dry cough Abd: soft; non-tender Skin: reveals no rash; warm and dry Psychological: alert and cooperative; normal mood and affect  Allergies  Allergen Reactions   Latex Hives   Mushroom Extract Complex Swelling    Past Medical History:  Diagnosis Date   Anemia    Anxiety    Depression    Family history of breast cancer    Family history of colon cancer    Headache(784.0)    Migraine headache with aura    Obesity (BMI 35.0-39.9 without comorbidity)    Social History   Socioeconomic History   Marital status: Single    Spouse name: Not on file   Number of children: Not on file  Years of education: Not on file   Highest education level: Not on file  Occupational History   Not on file  Tobacco Use   Smoking status: Never Smoker   Smokeless tobacco: Never Used  Vaping Use   Vaping Use: Never used  Substance and Sexual Activity   Alcohol use: No   Drug use: No   Sexual activity: Yes    Birth control/protection: None  Other Topics Concern   Not on file  Social History Narrative   Not on file   Social Determinants  of Health   Financial Resource Strain:    Difficulty of Paying Living Expenses:   Food Insecurity:    Worried About Charity fundraiser in the Last Year:    Arboriculturist in the Last Year:   Transportation Needs:    Film/video editor (Medical):    Lack of Transportation (Non-Medical):   Physical Activity:    Days of Exercise per Week:    Minutes of Exercise per Session:   Stress:    Feeling of Stress :   Social Connections:    Frequency of Communication with Friends and Family:    Frequency of Social Gatherings with Friends and Family:    Attends Religious Services:    Active Member of Clubs or Organizations:    Attends Music therapist:    Marital Status:   Intimate Partner Violence:    Fear of Current or Ex-Partner:    Emotionally Abused:    Physically Abused:    Sexually Abused:    Family History  Problem Relation Age of Onset   Breast cancer Maternal Grandmother 32   Breast cancer Paternal Grandmother        age dx unk   Cancer Father 15       lung cancer, hx smoking   Thyroid disease Sister    Hypertension Sister    Breast cancer Mother 55       left breast, dx right breast cancer at 85   Colon cancer Mother 57   Breast cancer Other        5 aternal great aunts with breast cancer in 15's          Vanessa Kick, MD 12/05/19 [phone]

## 2019-12-24 ENCOUNTER — Ambulatory Visit (INDEPENDENT_AMBULATORY_CARE_PROVIDER_SITE_OTHER): Payer: Medicaid Other

## 2019-12-24 ENCOUNTER — Ambulatory Visit (HOSPITAL_COMMUNITY)
Admission: EM | Admit: 2019-12-24 | Discharge: 2019-12-24 | Disposition: A | Discharge status: Home / Self Care | Payer: Medicaid Other | Attending: Family Medicine | Admitting: Family Medicine

## 2019-12-24 ENCOUNTER — Encounter (HOSPITAL_COMMUNITY): Payer: Self-pay

## 2019-12-24 ENCOUNTER — Ambulatory Visit (HOSPITAL_COMMUNITY): Payer: Medicaid Other

## 2019-12-24 ENCOUNTER — Other Ambulatory Visit: Payer: Self-pay

## 2019-12-24 DIAGNOSIS — M79602 Pain in left arm: Secondary | ICD-10-CM | POA: Diagnosis not present

## 2019-12-24 DIAGNOSIS — S6991XA Unspecified injury of right wrist, hand and finger(s), initial encounter: Secondary | ICD-10-CM | POA: Diagnosis not present

## 2019-12-24 MED ORDER — IBUPROFEN 800 MG PO TABS
800.0000 mg | ORAL_TABLET | Freq: Three times a day (TID) | ORAL | 0 refills | Status: DC
Start: 2019-12-24 — End: 2020-04-22

## 2019-12-24 NOTE — ED Provider Notes (Signed)
[facility]    CSN: [id number] Arrival date & time: 12/24/19  1501      History   Chief Complaint Chief Complaint  Patient presents with  . Arm Pain  . Wrist Pain    HPI Shirley Ponce is a 44 y.o. female.   HPI  Patient injured her arm while trying to change a tire on a car.  She states that she was unable to get the jack to raise the tire all the way.  She was reaching up over the top of the tire to wiggle the tire loose when the car fell down and the fender hit the top of her arm.  She has bruising and pain.  She is here for evaluation.  Past Medical History:  Diagnosis Date  . Anemia   . Anxiety   . Depression   . Family history of breast cancer   . Family history of colon cancer   . Headache(784.0)   . Migraine headache with aura   . Obesity (BMI 35.0-39.9 without comorbidity)     Patient Active Problem List   Diagnosis Date Noted  . 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 Genetic testing 10/22/2017  . Family history of breast cancer   . Family history of colon cancer   . Breast pain, right 11/22/2012    Past Surgical History:  Procedure Laterality Date  . CESAREAN SECTION  2010  . CHOLECYSTECTOMY    . DILATION AND CURETTAGE OF UTERUS    . WISDOM TOOTH EXTRACTION      OB History    Gravida  4   Para  3   Term  3   Preterm      AB  1   Living  3     SAB  1   TAB      Ectopic      Multiple      Live Births               Home Medications    Prior to Admission medications   Medication Sig Start Date End Date Taking? Authorizing Provider  acetaminophen (TYLENOL) 500 MG tablet Take 500 mg by mouth every 6 (six) hours as needed.    [provider]  aspirin-acetaminophen-caffeine (EXCEDRIN MIGRAINE) [phone] MG per tablet Take 1 tablet by mouth every 6 (six) hours as needed for migraine.     [provider]  hydrochlorothiazide (HYDRODIURIL) 25 MG tablet Take by mouth. 04/10/19   [provider]  ibuprofen (ADVIL) 800 MG tablet Take 1 tablet (800 mg total) by mouth 3 (three) times daily. 12/24/19   Raylene Everts, MD  Multiple Vitamin (MULTIVITAMIN) tablet Take 1 tablet by mouth daily.    [provider]  potassium chloride SA (KLOR-CON) 20 MEQ tablet Take 1 tablet (20 mEq total) by mouth daily. 03/14/19   Sherwood Gambler, MD  SUMAtriptan (IMITREX) 100 MG tablet Take 100 mg by mouth every 2 (two) hours as needed for migraine or headache. May repeat in 2 hours if headache persists or recurs.    [provider]  thiamine (VITAMIN B-1) 100 MG tablet Take 100 mg by mouth daily.    [provider]    Family History Family History  Problem Relation Age of Onset  . Breast cancer Maternal Grandmother 71  . Breast cancer Paternal Grandmother        age dx unk  . Cancer Father 55  lung cancer, hx smoking  . Thyroid disease Sister   . Hypertension Sister   . Breast cancer Mother 73       left breast, dx right breast cancer at 38  . Colon cancer Mother 37  . Breast cancer Other        5 aternal great aunts with breast cancer in 67's    Social History Social History   Tobacco Use  . Smoking status: Never Smoker  . Smokeless tobacco: Never Used  Vaping Use  . Vaping Use: Never used  Substance Use Topics  . Alcohol use: No  . Drug use: No     Allergies   Latex and Mushroom extract complex   Review of Systems Review of Systems See HPI  Physical Exam Triage Vital Signs ED Triage Vitals  Enc Vitals Group     BP 12/24/19 1541 121/81     Pulse Rate 12/24/19 1541 82     Resp 12/24/19 1541 18     Temp 12/24/19 1541 98.9 F (37.2 C)     Temp Source 12/24/19 1541 Oral     SpO2 12/24/19 1541 100 %     Weight --      Height --      Head Circumference --      Peak Flow --      Pain Score 12/24/19 1539 10     Pain Loc --      Pain Edu? --      Excl. in [place]? --    No data found.  Updated Vital Signs BP 121/81 (BP  Location: Left Arm)   Pulse 82   Temp 98.9 F (37.2 C) (Oral)   Resp 18   LMP 11/28/2019 (Exact Date) Comment: 3 weeks  SpO2 100%    Physical Exam Constitutional:      General: She is not in acute distress.    Appearance: She is well-developed.  HENT:     Head: Normocephalic and atraumatic.     Mouth/Throat:     Comments: Mask in place Eyes:     Conjunctiva/sclera: Conjunctivae normal.     Pupils: Pupils are equal, round, and reactive to light.  Cardiovascular:     Rate and Rhythm: Normal rate.  Pulmonary:     Effort: Pulmonary effort is normal. No respiratory distress.  Musculoskeletal:        General: Normal range of motion.     Left wrist: Normal.       Arms:     Cervical back: Normal range of motion.  Skin:    General: Skin is warm and dry.  Neurological:     Mental Status: She is alert.      UC Treatments / Results  Labs (all labs ordered are listed, but only abnormal results are displayed) Labs Reviewed - No data to display  EKG   Radiology DG Hand Complete Right  Result Date: 12/24/2019 CLINICAL DATA:  The patient suffered a blow to the right hand changing a tire today. Initial encounter. EXAM: RIGHT HAND - COMPLETE 3+ VIEW COMPARISON:  None. FINDINGS: There is no evidence of fracture or dislocation. There is no evidence of arthropathy or other focal bone abnormality. Soft tissues are unremarkable. IMPRESSION: Normal exam. Electronically Signed   By: Inge Rise M.D.   On: 12/24/2019 17:24    Procedures Procedures (including critical care time)  Medications Ordered in UC Medications - No data to display  Initial Impression / Assessment and Plan / UC Course  I have reviewed the triage vital signs and the nursing notes.  Pertinent labs & imaging results that were available during my care of the patient were reviewed by me and considered in my medical decision making (see chart for details).      Final Clinical Impressions(s) / UC Diagnoses     Final diagnoses:  Left arm pain     Discharge Instructions     Ice and elevate to reduce pain and swelling Take ibuprofen as needed for pain Wear brace as needed for discomfort.  This will likely be needed for a couple of weeks  Return if not improving in the next week, or see orthopedics   ED Prescriptions    Medication Sig Dispense Auth. Provider   ibuprofen (ADVIL) 800 MG tablet Take 1 tablet (800 mg total) by mouth 3 (three) times daily. 21 tablet Raylene Everts, MD     PDMP not reviewed this encounter.   Raylene Everts, MD 12/24/19 [phone]

## 2019-12-24 NOTE — Discharge Instructions (Signed)
Ice and elevate to reduce pain and swelling Take ibuprofen as needed for pain Wear brace as needed for discomfort.  This will likely be needed for a couple of weeks  Return if not improving in the next week, or see orthopedics

## 2019-12-24 NOTE — ED Triage Notes (Signed)
Pt presents with right arm pain and right wrist pain since last night. States she was changing a flat tired of her car fell over her right arm. Ibuprofen do not gives relief.

## 2020-02-25 ENCOUNTER — Other Ambulatory Visit: Payer: Self-pay

## 2020-02-25 ENCOUNTER — Encounter (HOSPITAL_BASED_OUTPATIENT_CLINIC_OR_DEPARTMENT_OTHER): Payer: Self-pay | Admitting: Emergency Medicine

## 2020-02-25 ENCOUNTER — Emergency Department (HOSPITAL_BASED_OUTPATIENT_CLINIC_OR_DEPARTMENT_OTHER)
Admission: EM | Admit: 2020-02-25 | Discharge: 2020-02-25 | Disposition: A | Discharge status: Home / Self Care | Payer: Medicaid Other | Attending: Emergency Medicine | Admitting: Emergency Medicine

## 2020-02-25 ENCOUNTER — Emergency Department (HOSPITAL_BASED_OUTPATIENT_CLINIC_OR_DEPARTMENT_OTHER): Payer: Medicaid Other

## 2020-02-25 DIAGNOSIS — Z91040 Latex allergy status: Secondary | ICD-10-CM | POA: Insufficient documentation

## 2020-02-25 DIAGNOSIS — Z7982 Long term (current) use of aspirin: Secondary | ICD-10-CM | POA: Insufficient documentation

## 2020-02-25 DIAGNOSIS — Z79899 Other long term (current) drug therapy: Secondary | ICD-10-CM | POA: Insufficient documentation

## 2020-02-25 DIAGNOSIS — G43109 Migraine with aura, not intractable, without status migrainosus: Secondary | ICD-10-CM | POA: Diagnosis not present

## 2020-02-25 DIAGNOSIS — G43809 Other migraine, not intractable, without status migrainosus: Secondary | ICD-10-CM

## 2020-02-25 DIAGNOSIS — H538 Other visual disturbances: Secondary | ICD-10-CM | POA: Diagnosis present

## 2020-02-25 LAB — PREGNANCY, URINE: Preg Test, Ur: NEGATIVE

## 2020-02-25 MED ORDER — METOCLOPRAMIDE HCL 5 MG/ML IJ SOLN
10.0000 mg | Freq: Once | INTRAMUSCULAR | Status: AC
  Administered 2020-02-25: 10 mg via INTRAVENOUS
  Filled 2020-02-25: qty 2

## 2020-02-25 MED ORDER — MAGNESIUM SULFATE 2 GM/50ML IV SOLN
2.0000 g | Freq: Once | INTRAVENOUS | Status: AC
  Administered 2020-02-25: 2 g via INTRAVENOUS
  Filled 2020-02-25: qty 50

## 2020-02-25 MED ORDER — KETOROLAC TROMETHAMINE 30 MG/ML IJ SOLN
30.0000 mg | Freq: Once | INTRAMUSCULAR | Status: AC
  Administered 2020-02-25: 30 mg via INTRAVENOUS
  Filled 2020-02-25: qty 1

## 2020-02-25 MED ORDER — DEXAMETHASONE SODIUM PHOSPHATE 4 MG/ML IJ SOLN
4.0000 mg | Freq: Once | INTRAMUSCULAR | Status: AC
  Administered 2020-02-25: 4 mg via INTRAVENOUS
  Filled 2020-02-25: qty 1

## 2020-02-25 MED ORDER — DIVALPROEX SODIUM ER 250 MG PO TB24: 500.0000 mg | ORAL_TABLET | Freq: Every day | ORAL | Status: DC

## 2020-02-25 MED ORDER — DIPHENHYDRAMINE HCL 50 MG/ML IJ SOLN
12.5000 mg | Freq: Once | INTRAMUSCULAR | Status: AC
  Administered 2020-02-25: 12.5 mg via INTRAVENOUS
  Filled 2020-02-25: qty 1

## 2020-02-25 MED ORDER — DIVALPROEX SODIUM ER 250 MG PO TB24
ORAL_TABLET | ORAL | Status: AC
  Administered 2020-02-25: 500 mg via ORAL
  Filled 2020-02-25: qty 2

## 2020-02-25 MED ORDER — DIVALPROEX SODIUM 500 MG PO DR TAB
500.0000 mg | DELAYED_RELEASE_TABLET | Freq: Once | ORAL | Status: DC
  Filled 2020-02-25: qty 1

## 2020-02-25 NOTE — ED Provider Notes (Signed)
[facility] EMERGENCY DEPARTMENT Provider Note   CSN: [id number] Arrival date & time: 02/25/20  0010     History No chief complaint on file.   Shirley Ponce is a 44 y.o. female.  The history is provided by the patient.  Migraine This is a recurrent problem. The current episode started more than 2 days ago. The problem occurs constantly. The problem has not changed since onset.Associated symptoms include headaches. Pertinent negatives include no chest pain and no shortness of breath. Nothing aggravates the symptoms. Nothing relieves the symptoms. Treatments tried: imitrex. The treatment provided no relief.  Patient with a h/o monthly migraines related to her cycle presents with L sided migraine with scotoma, nausea and vomiting.  No f/c/r.  No no loss of taste or smell.       Past Medical History:  Diagnosis Date  . Anemia   . Anxiety   . Depression   . Family history of breast cancer   . Family history of colon cancer   . Headache(784.0)   . Migraine headache with aura   . Obesity (BMI 35.0-39.9 without comorbidity)     Patient Active Problem List   Diagnosis Date Noted  . 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 Genetic testing 10/22/2017  . Family history of breast cancer   . Family history of colon cancer   . Breast pain, right 11/22/2012    Past Surgical History:  Procedure Laterality Date  . CESAREAN SECTION  2010  . CHOLECYSTECTOMY    . DILATION AND CURETTAGE OF UTERUS    . WISDOM TOOTH EXTRACTION       OB History    Gravida  4   Para  3   Term  3   Preterm      AB  1   Living  3     SAB  1   TAB      Ectopic      Multiple      Live Births              Family History  Problem Relation Age of Onset  . Breast cancer Maternal Grandmother 77  . Breast cancer Paternal Grandmother        age dx unk  . Cancer Father 61       lung cancer, hx smoking  . Thyroid disease Sister   . Hypertension Sister   . Breast cancer  Mother 24       left breast, dx right breast cancer at 57  . Colon cancer Mother 67  . Breast cancer Other        5 aternal great aunts with breast cancer in 2's    Social History   Tobacco Use  . Smoking status: Never Smoker  . Smokeless tobacco: Never Used  Vaping Use  . Vaping Use: Never used  Substance Use Topics  . Alcohol use: No  . Drug use: No    Home Medications Prior to Admission medications   Medication Sig Start Date End Date Taking? Authorizing Provider  acetaminophen (TYLENOL) 500 MG tablet Take 500 mg by mouth every 6 (six) hours as needed.    [provider]  aspirin-acetaminophen-caffeine (EXCEDRIN MIGRAINE) [phone] MG per tablet Take 1 tablet by mouth every 6 (six) hours as needed for migraine.     [provider]  hydrochlorothiazide (HYDRODIURIL) 25 MG tablet Take by mouth. 04/10/19   [provider]  ibuprofen (ADVIL) 800 MG tablet  Take 1 tablet (800 mg total) by mouth 3 (three) times daily. 12/24/19   Raylene Everts, MD  Multiple Vitamin (MULTIVITAMIN) tablet Take 1 tablet by mouth daily.    [provider]  potassium chloride SA (KLOR-CON) 20 MEQ tablet Take 1 tablet (20 mEq total) by mouth daily. 03/14/19   Sherwood Gambler, MD  SUMAtriptan (IMITREX) 100 MG tablet Take 100 mg by mouth every 2 (two) hours as needed for migraine or headache. May repeat in 2 hours if headache persists or recurs.    [provider]  thiamine (VITAMIN B-1) 100 MG tablet Take 100 mg by mouth daily.    [provider]    Allergies    Latex and Mushroom extract complex  Review of Systems   Review of Systems  Constitutional: Negative for fever.  HENT: Negative for congestion.   Eyes: Negative for visual disturbance.  Respiratory: Negative for cough and shortness of breath.   Cardiovascular: Negative for chest pain.  Gastrointestinal: Negative for blood in stool.  Genitourinary: Negative for difficulty urinating.    Musculoskeletal: Negative for arthralgias, neck pain and neck stiffness.  Skin: Negative for rash.  Neurological: Positive for headaches. Negative for facial asymmetry, speech difficulty and weakness.  Psychiatric/Behavioral: Negative for agitation.  All other systems reviewed and are negative.   Physical Exam Updated Vital Signs BP (!) 143/100   Pulse 87   Temp 98.9 F (37.2 C)   Resp 17   Wt 98.4 kg   LMP  (LMP Unknown)   SpO2 100%   BMI 32.99 kg/m   Physical Exam Vitals and nursing note reviewed.  Constitutional:      General: She is not in acute distress.    Appearance: Normal appearance.  HENT:     Head: Normocephalic and atraumatic.     Nose: Nose normal.  Eyes:     Extraocular Movements: Extraocular movements intact.     Conjunctiva/sclera: Conjunctivae normal.     Pupils: Pupils are equal, round, and reactive to light.     Comments: No proptosis, disks are sharp.  Intact cognition   Cardiovascular:     Rate and Rhythm: Normal rate and regular rhythm.     Pulses: Normal pulses.     Heart sounds: Normal heart sounds.  Pulmonary:     Effort: Pulmonary effort is normal.     Breath sounds: Normal breath sounds.  Abdominal:     General: Abdomen is flat. Bowel sounds are normal.     Palpations: Abdomen is soft.     Tenderness: There is no abdominal tenderness. There is no guarding.  Musculoskeletal:        General: Normal range of motion.     Cervical back: Normal range of motion and neck supple. No rigidity.  Lymphadenopathy:     Cervical: No cervical adenopathy.  Skin:    General: Skin is warm and dry.     Capillary Refill: Capillary refill takes less than 2 seconds.  Neurological:     General: No focal deficit present.     Mental Status: She is alert and oriented to person, place, and time.     Cranial Nerves: No cranial nerve deficit.     Deep Tendon Reflexes: Reflexes normal.  Psychiatric:        Mood and Affect: Mood normal.        Behavior:  Behavior normal.     ED Results / Procedures / Treatments   Labs (all labs ordered are listed, but  only abnormal results are displayed) Labs Reviewed  PREGNANCY, URINE    EKG None  Radiology No results found.  Procedures Procedures (including critical care time)  Medications Ordered in ED Medications  divalproex (DEPAKOTE ER) 24 hr tablet 500 mg (500 mg Oral Given 02/25/20 0434)  ketorolac (TORADOL) 30 MG/ML injection 30 mg (30 mg Intravenous Given 02/25/20 0315)  magnesium sulfate IVPB 2 g 50 mL (0 g Intravenous Stopped 02/25/20 0417)  metoCLOPramide (REGLAN) injection 10 mg (10 mg Intravenous Given 02/25/20 0315)  diphenhydrAMINE (BENADRYL) injection 12.5 mg (12.5 mg Intravenous Given 02/25/20 0315)  dexamethasone (DECADRON) injection 4 mg (4 mg Intravenous Given 02/25/20 0437)    ED Course  I have reviewed the triage vital signs and the nursing notes.  Pertinent labs & imaging results that were available during my care of the patient were reviewed by me and considered in my medical decision making (see chart for details).  Migraine. Normal Head CT, no mass, no ICH.  No signs of ICH clinically, no signs of cavernous sinus thrombosis.  No signs of intracranial infection nor sepsis.  Well appearing.  Headache has improved with medication.  Stable for discharge with close follow up.    [name] was evaluated in Emergency Department on 02/25/2020 for the symptoms described in the history of present illness. She was evaluated in the context of the global COVID-19 pandemic, which necessitated consideration that the patient might be at risk for infection with the SARS-CoV-2 virus that causes COVID-19. Institutional protocols and algorithms that pertain to the evaluation of patients at risk for COVID-19 are in a state of rapid change based on information released by regulatory bodies including the CDC and federal and state organizations. These policies and algorithms were followed during  the patient's care in the ED.   Final Clinical Impression(s) / ED Diagnoses Return for intractable cough, coughing up blood,fevers >100.4 unrelieved by medication, shortness of breath, intractable vomiting, chest pain, shortness of breath, weakness,numbness, changes in speech, facial asymmetry,abdominal pain, passing out,Inability to tolerate liquids or food, cough, altered mental status or any concerns. No signs of systemic illness or infection. The patient is nontoxic-appearing on exam and vital signs are within normal limits.   I have reviewed the triage vital signs and the nursing notes. Pertinent labs &imaging results that were available during my care of the patient were reviewed by me and considered in my medical decision making (see chart for details).After history, exam, and medical workup I feel the patient has beenappropriately medically screened and is safe for discharge home. Pertinent diagnoses were discussed with the patient. Patient was given return precautions.    Glorine Hanratty, MD 02/25/20 [phone]

## 2020-02-25 NOTE — ED Triage Notes (Signed)
Left sided migraine x2 days. Blurred vision, seeing spots. Nausea, no vomiting. States imitrex and motrin not effective.

## 2020-04-19 ENCOUNTER — Ambulatory Visit (INDEPENDENT_AMBULATORY_CARE_PROVIDER_SITE_OTHER): Payer: Medicaid Other | Admitting: Obstetrics & Gynecology

## 2020-04-19 ENCOUNTER — Other Ambulatory Visit (HOSPITAL_COMMUNITY)
Admission: RE | Admit: 2020-04-19 | Discharge: 2020-04-19 | Disposition: A | Discharge status: Home / Self Care | Payer: Medicaid Other | Source: Ambulatory Visit | Attending: Obstetrics & Gynecology | Admitting: Obstetrics & Gynecology

## 2020-04-19 ENCOUNTER — Other Ambulatory Visit: Payer: Self-pay

## 2020-04-19 ENCOUNTER — Encounter: Payer: Self-pay | Admitting: Obstetrics & Gynecology

## 2020-04-19 VITALS — BP 131/88 | HR 83 | Ht 68.0 in | Wt 226.0 lb

## 2020-04-19 DIAGNOSIS — N938 Other specified abnormal uterine and vaginal bleeding: Secondary | ICD-10-CM | POA: Diagnosis not present

## 2020-04-19 DIAGNOSIS — N871 Moderate cervical dysplasia: Secondary | ICD-10-CM

## 2020-04-19 NOTE — Patient Instructions (Signed)
Abnormal Uterine Bleeding Abnormal uterine bleeding means bleeding more than usual from your uterus. It can include:  Bleeding between periods.  Bleeding after sex.  Bleeding that is heavier than normal.  Periods that last longer than usual.  Bleeding after you have stopped having your period (menopause). There are many problems that may cause this. You should see a doctor for any kind of bleeding that is not normal. Treatment depends on the cause of the bleeding. Follow these instructions at home:  Watch your condition for any changes.  Do not use tampons, douche, or have sex, if your doctor tells you not to.  Change your pads often.  Get regular well-woman exams. Make sure they include a pelvic exam and cervical cancer screening.  Keep all follow-up visits as told by your doctor. This is important. Contact a doctor if:  The bleeding lasts more than one week.  You feel dizzy at times.  You feel like you are going to throw up (nauseous).  You throw up. Get help right away if:  You pass out.  You have to change pads every hour.  You have belly (abdominal) pain.  You have a fever.  You get sweaty.  You get weak.  You passing large blood clots from your vagina. Summary  Abnormal uterine bleeding means bleeding more than usual from your uterus.  There are many problems that may cause this. You should see a doctor for any kind of bleeding that is not normal.  Treatment depends on the cause of the bleeding. This information is not intended to replace advice given to you by your health care provider. Make sure you discuss any questions you have with your health care provider. Document Revised: 05/19/2016 Document Reviewed: 05/19/2016 Elsevier Patient Education  2020 [employer].

## 2020-04-19 NOTE — Progress Notes (Signed)
Patient ID: Shirley Ponce, female   DOB: [birth year], 44 y.o.   MRN: [id number]  Chief Complaint  Patient presents with  . Abnormal Pap Smear    HPI Shirley Ponce is a 44 y.o. female.  [id number] Patient's last menstrual period was 03/24/2020 (exact date). She has had 4 months of intermenstrual spotting with heavier menses every month with cramps. 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 She is very nervous about her risk of cancer and states she is susceptible to conspiracy theories. HPI  Past Medical History:  Diagnosis Date  . Anemia   . Anxiety   . Depression   . Family history of breast cancer   . Family history of colon cancer   . Headache(784.0)   . Migraine headache with aura   . Obesity (BMI 35.0-39.9 without comorbidity)     Past Surgical History:  Procedure Laterality Date  . CESAREAN SECTION  2010  . CHOLECYSTECTOMY    . DILATION AND CURETTAGE OF UTERUS    . WISDOM TOOTH EXTRACTION      Family History  Problem Relation Age of Onset  . Breast cancer Maternal Grandmother 17  . Breast cancer Paternal Grandmother        age dx unk  . Cancer Father 72       lung cancer, hx smoking  . Thyroid disease Sister   . Hypertension Sister   . Breast cancer Mother 44       left breast, dx right breast cancer at 57  . Colon cancer Mother 90  . Breast cancer Other        5 aternal great aunts with breast cancer in 46's    Social History Social History   Tobacco Use  . Smoking status: Never Smoker  . Smokeless tobacco: Never Used  Vaping Use  . Vaping Use: Never used  Substance Use Topics  . Alcohol use: No  . Drug use: No    Allergies  Allergen Reactions  . Latex Hives  . Mushroom Extract Complex Swelling    Current Outpatient Medications  Medication Sig Dispense Refill  . ibuprofen (ADVIL) 800 MG tablet Take 1 tablet (800 mg total) by mouth 3 (three) times daily. 21 tablet 0  . Multiple Vitamin (MULTIVITAMIN) tablet Take 1 tablet by mouth daily.    . SUMAtriptan (IMITREX)  100 MG tablet Take 100 mg by mouth every 2 (two) hours as needed for migraine or headache. May repeat in 2 hours if headache persists or recurs.    Marland Kitchen acetaminophen (TYLENOL) 500 MG tablet Take 500 mg by mouth every 6 (six) hours as needed. (Patient not taking: Reported on 04/19/2020)    . aspirin-acetaminophen-caffeine (EXCEDRIN MIGRAINE) 250-250-65 MG per tablet Take 1 tablet by mouth every 6 (six) hours as needed for migraine.     . hydrochlorothiazide (HYDRODIURIL) 25 MG tablet Take by mouth.    . potassium chloride SA (KLOR-CON) 20 MEQ tablet Take 1 tablet (20 mEq total) by mouth daily. 3 tablet 0  . thiamine (VITAMIN B-1) 100 MG tablet Take 100 mg by mouth daily. (Patient not taking: Reported on 04/19/2020)     No current facility-administered medications for this visit.    Review of Systems Review of Systems  Constitutional: Negative.   Genitourinary: Positive for menstrual problem, pelvic pain (cramps) and vaginal bleeding.  Psychiatric/Behavioral: Positive for agitation. The patient is nervous/anxious.     Blood pressure 131/88, pulse 83, height 5\' 8"  (1.727 m), weight 226 lb (102.5 kg), last  menstrual period 03/24/2020.  Physical Exam Physical Exam Vitals and nursing note reviewed. Exam conducted with a chaperone present.  Constitutional:      Appearance: She is not ill-appearing.  Pulmonary:     Effort: Pulmonary effort is normal.  Abdominal:     General: Abdomen is flat.  Genitourinary:    Vagina: Vaginal discharge (pink) present.     Comments: Pelvic exam: normal external genitalia, vulva, vagina, cervix, uterus and adnexa, pink D/C, pap done.  Skin:    General: Skin is warm and dry.  Neurological:     General: No focal deficit present.     Mental Status: She is alert.  Psychiatric:     Comments: anxious     Data Reviewed pathology reports  Assessment DUB (dysfunctional uterine bleeding) - Plan: Cervicovaginal ancillary only( [place]), US PELVIC COMPLETE  WITH TRANSVAGINAL, CANCELED: Cytology - PAP( [place])  Moderate dysplasia of cervix (CIN II) - Plan: Cytology - PAP( [place])    Plan F/u on [place] and cytology results and f/u in office  She had Mirena in the past and may consider reinsertion if appropriate    Emeterio Reeve 04/19/2020, 11:43 AM

## 2020-04-19 NOTE — Progress Notes (Signed)
RGYN pt presents for problem visit today.  Pt had LEEP 05/29/19 pap was done at PCP. HGSIL   CC: AUB and Cramping , pt states sometimes cramping radiates down leg. Pt states with bleeding she passes clots. Also, notes vaginal discharge. No odor.   LMP: 03/24/2020

## 2020-04-21 ENCOUNTER — Emergency Department (HOSPITAL_BASED_OUTPATIENT_CLINIC_OR_DEPARTMENT_OTHER): Payer: Medicaid Other

## 2020-04-21 ENCOUNTER — Other Ambulatory Visit: Payer: Self-pay

## 2020-04-21 ENCOUNTER — Encounter (HOSPITAL_BASED_OUTPATIENT_CLINIC_OR_DEPARTMENT_OTHER): Payer: Self-pay | Admitting: Emergency Medicine

## 2020-04-21 ENCOUNTER — Emergency Department (HOSPITAL_BASED_OUTPATIENT_CLINIC_OR_DEPARTMENT_OTHER)
Admission: EM | Admit: 2020-04-21 | Discharge: 2020-04-21 | Disposition: A | Discharge status: Home / Self Care | Payer: Medicaid Other | Attending: Emergency Medicine | Admitting: Emergency Medicine

## 2020-04-21 DIAGNOSIS — A5901 Trichomonal vulvovaginitis: Secondary | ICD-10-CM | POA: Insufficient documentation

## 2020-04-21 DIAGNOSIS — Z7982 Long term (current) use of aspirin: Secondary | ICD-10-CM | POA: Diagnosis not present

## 2020-04-21 DIAGNOSIS — N939 Abnormal uterine and vaginal bleeding, unspecified: Secondary | ICD-10-CM | POA: Diagnosis present

## 2020-04-21 DIAGNOSIS — Z91040 Latex allergy status: Secondary | ICD-10-CM | POA: Diagnosis not present

## 2020-04-21 DIAGNOSIS — N83202 Unspecified ovarian cyst, left side: Secondary | ICD-10-CM | POA: Diagnosis not present

## 2020-04-21 DIAGNOSIS — D259 Leiomyoma of uterus, unspecified: Secondary | ICD-10-CM | POA: Insufficient documentation

## 2020-04-21 DIAGNOSIS — Z79899 Other long term (current) drug therapy: Secondary | ICD-10-CM | POA: Diagnosis not present

## 2020-04-21 DIAGNOSIS — N83201 Unspecified ovarian cyst, right side: Secondary | ICD-10-CM | POA: Insufficient documentation

## 2020-04-21 DIAGNOSIS — N83209 Unspecified ovarian cyst, unspecified side: Secondary | ICD-10-CM

## 2020-04-21 DIAGNOSIS — R109 Unspecified abdominal pain: Secondary | ICD-10-CM | POA: Diagnosis not present

## 2020-04-21 LAB — URINALYSIS, MICROSCOPIC (REFLEX): RBC / HPF: >50 RBC/hpf (ref 0–5)

## 2020-04-21 LAB — PREGNANCY, URINE: Preg Test, Ur: NEGATIVE

## 2020-04-21 LAB — CBC WITH DIFFERENTIAL/PLATELET
Abs Immature Granulocytes: 0 10*3/uL (ref 0.00–0.07)
Basophils Absolute: 0 10*3/uL (ref 0.0–0.1)
Basophils Relative: 0 %
Eosinophils Absolute: 0.1 10*3/uL (ref 0.0–0.5)
Eosinophils Relative: 2 %
HCT: 35.6 % — ABNORMAL LOW (ref 36.0–46.0)
Hemoglobin: 11.4 g/dL — ABNORMAL LOW (ref 12.0–15.0)
Immature Granulocytes: 0 %
Lymphocytes Relative: 32 %
Lymphs Abs: 1.4 10*3/uL (ref 0.7–4.0)
MCH: 29.6 pg (ref 26.0–34.0)
MCHC: 32 g/dL (ref 30.0–36.0)
MCV: 92.5 fL (ref 80.0–100.0)
Monocytes Absolute: 0.4 10*3/uL (ref 0.1–1.0)
Monocytes Relative: 10 %
Neutro Abs: 2.5 10*3/uL (ref 1.7–7.7)
Neutrophils Relative %: 56 %
Platelets: 225 10*3/uL (ref 150–400)
RBC: 3.85 MIL/uL — ABNORMAL LOW (ref 3.87–5.11)
RDW: 13.7 % (ref 11.5–15.5)
WBC: 4.5 10*3/uL (ref 4.0–10.5)
nRBC: 0 % (ref 0.0–0.2)

## 2020-04-21 LAB — URINALYSIS, ROUTINE W REFLEX MICROSCOPIC

## 2020-04-21 MED ORDER — ONDANSETRON 4 MG PO TBDP: 8.0000 mg | ORAL_TABLET | Freq: Once | ORAL | Status: DC

## 2020-04-21 MED ORDER — HYDROCODONE-ACETAMINOPHEN 5-325 MG PO TABS: 1.0000 | ORAL_TABLET | ORAL | 0 refills | Status: DC | PRN

## 2020-04-21 MED ORDER — ONDANSETRON 4 MG PO TBDP
4.0000 mg | ORAL_TABLET | Freq: Once | ORAL | Status: AC
  Administered 2020-04-21: 4 mg via ORAL

## 2020-04-21 MED ORDER — HYDROCODONE-ACETAMINOPHEN 5-325 MG PO TABS
ORAL_TABLET | ORAL | Status: AC
  Filled 2020-04-21: qty 1

## 2020-04-21 MED ORDER — HYDROCODONE-ACETAMINOPHEN 5-325 MG PO TABS
1.0000 | ORAL_TABLET | Freq: Once | ORAL | Status: AC
  Administered 2020-04-21: 1 via ORAL

## 2020-04-21 MED ORDER — ONDANSETRON 4 MG PO TBDP
ORAL_TABLET | ORAL | Status: AC
  Filled 2020-04-21: qty 1

## 2020-04-21 MED ORDER — MELOXICAM 7.5 MG PO TABS: 7.5000 mg | ORAL_TABLET | Freq: Every day | ORAL | 0 refills | Status: AC

## 2020-04-21 MED ORDER — METRONIDAZOLE 500 MG PO TABS: 500.0000 mg | ORAL_TABLET | Freq: Two times a day (BID) | ORAL | 0 refills | Status: DC

## 2020-04-21 NOTE — Discharge Instructions (Addendum)
Take Flagyl as prescribed and complete the full course. Take Meloxicam as prescribed for pain. Take Norco for pain not controlled with Meloxicam. Follow up with your GYN or see list below for follow up.   Consider these options for your care in the future:   Walk-ins for certain complaints available at:   [facility] [address]  [phone]  See hours at [email or web address]   [facility] [employer] [facility]  [place]  ([place] [facility] [employer] at [employer]  [phone]   You can make an appointment to see a GYN provider:   [facility] [place] at [place]  [phone]   [facility] [place] at [facility]  [place]  [phone]   [facility] [place] at [place]  [phone]   [facility] [place] at [facility]  [address] [place]  [phone]   If you already have an established OB/GYN provider in the area you can make an appointment with them as well.

## 2020-04-21 NOTE — ED Triage Notes (Signed)
Pt reports pelvic pain and vaginal bleeding for several months. States it worsened last night. She was seen 2 days ago and diagnosed with dysfunctional uterine bleeding. Told to come to ED if it got worse.

## 2020-04-21 NOTE — ED Provider Notes (Signed)
[facility] EMERGENCY DEPARTMENT Provider Note   CSN: [id number] Arrival date & time: 04/21/20  1159     History Chief Complaint  Patient presents with  . Pelvic Pain  . Vaginal Bleeding    Shirley Ponce is a 44 y.o. female.  44 year old female presents with complaint of pelvic pain and heavy vaginal bleeding. Patient reports last menstrual cycle was 03/31/20, has had intermittent bleeding since that time with heavy bleeding for the past week. Patient reports using three pads/hour, passing clots. Reports pelvic pain, denies shortness of breath, chest pain, dizziness. Patient was her GYN two days ago, had a pelvic exam with [place] scheduled for tomorrow, diagnosed with DUB. Patient has been researching her symptoms, is concerned about possible fibroid, concern for need for hysterectomy or other intervention. Prior history of IDA following the birth of her son. Not currently on an iron supplement, no prior transfusions.         Past Medical History:  Diagnosis Date  . Anemia   . Anxiety   . Depression   . Family history of breast cancer   . Family history of colon cancer   . Headache(784.0)   . Migraine headache with aura   . Obesity (BMI 35.0-39.9 without comorbidity)     Patient Active Problem List   Diagnosis Date Noted  . 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 Genetic testing 10/22/2017  . Family history of breast cancer   . Family history of colon cancer   . Breast pain, right 11/22/2012    Past Surgical History:  Procedure Laterality Date  . CESAREAN SECTION  2010  . CHOLECYSTECTOMY    . DILATION AND CURETTAGE OF UTERUS    . WISDOM TOOTH EXTRACTION       OB History    Gravida  4   Para  3   Term  3   Preterm      AB  1   Living  3     SAB  1   TAB      Ectopic      Multiple      Live Births              Family History  Problem Relation Age of Onset  . Breast cancer Maternal Grandmother 71  . Breast cancer Paternal  Grandmother        age dx unk  . Cancer Father 8       lung cancer, hx smoking  . Thyroid disease Sister   . Hypertension Sister   . Breast cancer Mother 57       left breast, dx right breast cancer at 47  . Colon cancer Mother 5  . Breast cancer Other        5 aternal great aunts with breast cancer in 44's    Social History   Tobacco Use  . Smoking status: Never Smoker  . Smokeless tobacco: Never Used  Vaping Use  . Vaping Use: Never used  Substance Use Topics  . Alcohol use: No  . Drug use: No    Home Medications Prior to Admission medications   Medication Sig Start Date End Date Taking? Authorizing Provider  acetaminophen (TYLENOL) 500 MG tablet Take 500 mg by mouth every 6 (six) hours as needed. Patient not taking: Reported on 04/19/2020    [provider]  aspirin-acetaminophen-caffeine (EXCEDRIN MIGRAINE) [phone] MG per tablet Take 1 tablet by mouth every 6 (six) hours as  needed for migraine.     [provider]  hydrochlorothiazide (HYDRODIURIL) 25 MG tablet Take by mouth. 04/10/19   [provider]  HYDROcodone-acetaminophen (NORCO/VICODIN) 5-325 MG tablet Take 1 tablet by mouth every 4 (four) hours as needed. 04/21/20   Tacy Learn, PA-C  ibuprofen (ADVIL) 800 MG tablet Take 1 tablet (800 mg total) by mouth 3 (three) times daily. 12/24/19   Raylene Everts, MD  meloxicam (MOBIC) 7.5 MG tablet Take 1 tablet (7.5 mg total) by mouth daily for 10 days. 04/21/20 05/01/20  Tacy Learn, PA-C  metroNIDAZOLE (FLAGYL) 500 MG tablet Take 1 tablet (500 mg total) by mouth 2 (two) times daily. 04/21/20   Tacy Learn, PA-C  Multiple Vitamin (MULTIVITAMIN) tablet Take 1 tablet by mouth daily.    [provider]  potassium chloride SA (KLOR-CON) 20 MEQ tablet Take 1 tablet (20 mEq total) by mouth daily. 03/14/19   Sherwood Gambler, MD  SUMAtriptan (IMITREX) 100 MG tablet Take 100 mg by mouth every 2 (two) hours as needed for migraine  or headache. May repeat in 2 hours if headache persists or recurs.    [provider]  thiamine (VITAMIN B-1) 100 MG tablet Take 100 mg by mouth daily. Patient not taking: Reported on 04/19/2020    [provider]    Allergies    Latex and Mushroom extract complex  Review of Systems   Review of Systems  Constitutional: Positive for fatigue.  Respiratory: Negative for shortness of breath.   Cardiovascular: Negative for chest pain.  Gastrointestinal: Negative for abdominal pain, constipation, diarrhea, nausea and vomiting.  Genitourinary: Positive for frequency, pelvic pain and vaginal bleeding. Negative for dysuria.  Musculoskeletal: Positive for back pain. Negative for arthralgias and myalgias.  Skin: Negative for rash and wound.  Allergic/Immunologic: Negative for immunocompromised state.  Neurological: Positive for weakness.  Hematological: Does not bruise/bleed easily.  Psychiatric/Behavioral: Negative for confusion.  All other systems reviewed and are negative.   Physical Exam Updated Vital Signs BP 113/66 (BP Location: Left Arm)   Pulse 72   Temp 98.7 F (37.1 C) (Oral)   Resp 18   Ht 5\' 8"  (1.727 m)   Wt 97.5 kg   LMP 03/24/2020 (Exact Date)   SpO2 100%   BMI 32.69 kg/m   Physical Exam Vitals and nursing note reviewed.  Constitutional:      General: She is not in acute distress.    Appearance: She is well-developed. She is not diaphoretic.  HENT:     Head: Normocephalic and atraumatic.  Cardiovascular:     Rate and Rhythm: Normal rate and regular rhythm.     Heart sounds: Normal heart sounds.  Pulmonary:     Effort: Pulmonary effort is normal.     Breath sounds: Normal breath sounds.  Abdominal:     Palpations: Abdomen is soft.     Tenderness: There is abdominal tenderness.  Skin:    General: Skin is warm and dry.     Findings: No erythema or rash.  Neurological:     Mental Status: She is alert and oriented to person, place, and time.    Psychiatric:        Mood and Affect: Mood is anxious.     ED Results / Procedures / Treatments   Labs (all labs ordered are listed, but only abnormal results are displayed) Labs Reviewed  URINALYSIS, ROUTINE W REFLEX MICROSCOPIC - Abnormal; Notable for the following components:  Result Value   Color, Urine RED (*)    APPearance TURBID (*)    Glucose, UA   (*)    Value: TEST NOT REPORTED DUE TO COLOR INTERFERENCE OF URINE PIGMENT   Hgb urine dipstick   (*)    Value: TEST NOT REPORTED DUE TO COLOR INTERFERENCE OF URINE PIGMENT   Bilirubin Urine   (*)    Value: TEST NOT REPORTED DUE TO COLOR INTERFERENCE OF URINE PIGMENT   Ketones, ur   (*)    Value: TEST NOT REPORTED DUE TO COLOR INTERFERENCE OF URINE PIGMENT   Protein, ur   (*)    Value: TEST NOT REPORTED DUE TO COLOR INTERFERENCE OF URINE PIGMENT   Nitrite   (*)    Value: TEST NOT REPORTED DUE TO COLOR INTERFERENCE OF URINE PIGMENT   Leukocytes,Ua   (*)    Value: TEST NOT REPORTED DUE TO COLOR INTERFERENCE OF URINE PIGMENT   All other components within normal limits  CBC WITH DIFFERENTIAL/PLATELET - Abnormal; Notable for the following components:   RBC 3.85 (*)    Hemoglobin 11.4 (*)    HCT 35.6 (*)    All other components within normal limits  URINALYSIS, MICROSCOPIC (REFLEX) - Abnormal; Notable for the following components:   Bacteria, UA MANY (*)    Trichomonas, UA PRESENT (*)    All other components within normal limits  PREGNANCY, URINE    EKG None  Radiology US PELVIC COMPLETE W TRANSVAGINAL AND TORSION R/O  Result Date: 04/21/2020 CLINICAL DATA:  Dysfunctional uterine bleeding EXAM: TRANSABDOMINAL AND TRANSVAGINAL ULTRASOUND OF PELVIS DOPPLER ULTRASOUND OF OVARIES TECHNIQUE: Both transabdominal and transvaginal ultrasound examinations of the pelvis were performed. Transabdominal technique was performed for global imaging of the pelvis including uterus, ovaries, adnexal regions, and pelvic cul-de-sac. It was  necessary to proceed with endovaginal exam following the transabdominal exam to visualize the uterus, endometrium and ovaries. Color and duplex Doppler ultrasound was utilized to evaluate blood flow to the ovaries. COMPARISON:  01/21/2010 FINDINGS: Uterus Measurements: 9.3 x 4.9 x 6.1 cm = volume: 147 mL. There is a submucosal fibroid along the posterior endometrial lining which measures 1.9 x 1.5 x 1.4 cm. It exerts mass-effect on the endometrium. Endometrium Thickness: 11 mm. There is a small amount of blood products within endometrium. Right ovary Measurements: 3.9 x 2.4 x 3.1 cm = volume: 16 mL. There is an ovarian mass without blood flow which measures 2.2 x 1.9 x 2.0 cm. It demonstrates internal lacy echogenicity and is most consistent with a hemorrhagic cyst. Left ovary Measurements: 2.8 x 2.1 x 2.5 cm = volume: 7.8 mL. Normal appearance/no adnexal mass. Pulsed Doppler evaluation of both ovaries demonstrates normal low-resistance arterial and venous waveforms. Other findings Trace free fluid. IMPRESSION: 1. There is a submucosal 1.9 cm fibroid which exerts mass effect on the endometrium. Electronically Signed   By: Valentino Saxon MD   On: 04/21/2020 15:25    Procedures Procedures (including critical care time)  Medications Ordered in ED Medications  ondansetron (ZOFRAN-ODT) 4 MG disintegrating tablet (has no administration in time range)  HYDROcodone-acetaminophen (NORCO/VICODIN) 5-325 MG per tablet (has no administration in time range)  HYDROcodone-acetaminophen (NORCO/VICODIN) 5-325 MG per tablet 1 tablet (1 tablet Oral Given 04/21/20 1536)  ondansetron (ZOFRAN-ODT) disintegrating tablet 4 mg (4 mg Oral Given 04/21/20 1536)    ED Course  I have reviewed the triage vital signs and the nursing notes.  Pertinent labs & imaging results that were available during my  care of the patient were reviewed by me and considered in my medical decision making (see chart for details).  Clinical  Course as of Apr 22 1639  Sun Apr 21, 2020  1534 [place] report has not crossed over, copied from pacs as follows: FINDINGS: Uterus  Measurements: 9.3 x 4.9 x 6.1 cm = volume: 147 mL. There is a submucosal fibroid along the posterior endometrial lining which measures 1.9 x 1.5 x 1.4 cm. It exerts mass-effect on the endometrium.  Endometrium  Thickness: 11 mm. There is a small amount of blood products within endometrium.  Right ovary  Measurements: 3.9 x 2.4 x 3.1 cm = volume: 16 mL. There is an ovarian mass without blood flow which measures 2.2 x 1.9 x 2.0 cm. It demonstrates internal lacy echogenicity and is most consistent with a hemorrhagic cyst.  Left ovary  Measurements: 2.8 x 2.1 x 2.5 cm = volume: 7.8 mL. Normal appearance/no adnexal mass.  Pulsed Doppler evaluation of both ovaries demonstrates normal low-resistance arterial and venous waveforms.  Other findings  Trace free fluid.  IMPRESSION: 1. There is a submucosal 1.9 cm fibroid which exerts mass effect on the endometrium.   Electronically Signed By: Valentino Saxon MD On: 04/21/2020 15:25   [LM]  [id number] 44 year old female with complaint of vaginal bleeding and pelvic pain.  On exam found to have tenderness across lower abdomen.  Patient was seen by her gynecologist 2 days ago, pelvic exam at that time as well as Pap smear and STD testing sent out.  Pelvic deferred at this time. Vital signs stable, labs reviewed, no significant change to hemoglobin, currently 11.4, does not require transfusion for her heavy vaginal bleeding.  Urinalysis is contaminated although is positive for trichomoniasis, will treat with Flagyl, discussed with patient and recommend partner treated. Urine pregnancy test is negative. Ultrasound shows right hemorrhagic ovarian cyst and uterine fibroid.  Discussed results with patient and recommend follow-up with gynecology.  Given prescription for Norco and meloxicam.   [LM]    Clinical Course  User Index [LM] Roque Lias   MDM Rules/Calculators/A&P                          Final Clinical Impression(s) / ED Diagnoses Final diagnoses:  Abnormal vaginal bleeding  Uterine leiomyoma, unspecified location  Hemorrhagic ovarian cyst  Trichomonal vaginitis    Rx / DC Orders ED Discharge Orders         Ordered    metroNIDAZOLE (FLAGYL) 500 MG tablet  2 times daily        04/21/20 1553    HYDROcodone-acetaminophen (NORCO/VICODIN) 5-325 MG tablet  Every 4 hours PRN        04/21/20 1553    meloxicam (MOBIC) 7.5 MG tablet  Daily        04/21/20 1553           Tacy Learn, PA-C 04/21/20 1640    Wyvonnia Dusky, MD 04/21/20 1729

## 2020-04-21 NOTE — ED Notes (Signed)
Patient transported to Ultrasound 

## 2020-04-22 ENCOUNTER — Other Ambulatory Visit: Payer: Self-pay | Admitting: Obstetrics & Gynecology

## 2020-04-22 ENCOUNTER — Ambulatory Visit: Admission: RE | Admit: 2020-04-22 | Payer: Medicaid Other | Source: Ambulatory Visit

## 2020-04-22 ENCOUNTER — Other Ambulatory Visit: Payer: Self-pay

## 2020-04-22 DIAGNOSIS — N938 Other specified abnormal uterine and vaginal bleeding: Secondary | ICD-10-CM

## 2020-04-22 LAB — CERVICOVAGINAL ANCILLARY ONLY
Bacterial Vaginitis (gardnerella): POSITIVE — AB
Candida Glabrata: NEGATIVE
Candida Vaginitis: NEGATIVE
Chlamydia: NEGATIVE
Comment: NEGATIVE
Comment: NEGATIVE
Comment: NEGATIVE
Comment: NEGATIVE
Comment: NEGATIVE
Comment: NORMAL
Neisseria Gonorrhea: NEGATIVE
Trichomonas: POSITIVE — AB

## 2020-04-22 MED ORDER — IBUPROFEN 800 MG PO TABS
800.0000 mg | ORAL_TABLET | Freq: Three times a day (TID) | ORAL | 0 refills | Status: DC
Start: 2020-04-22 — End: 2021-01-13

## 2020-04-22 MED ORDER — MEGESTROL ACETATE 40 MG PO TABS: 40.0000 mg | ORAL_TABLET | Freq: Two times a day (BID) | ORAL | 3 refills | Status: DC

## 2020-04-22 NOTE — Telephone Encounter (Signed)
Patient called stating she is in severe pain and her bleeding has not stop. The ER told her to follow up here at the office. She does not want to go back to the ER. Dr.Arnold wanted her to f/up in 4 weeks after her u/s. Her u/s was completed 04/21/2020 and noted with small fibroid and small cyst.  Per Dr.Arnold patient can start Megace and try motrin 800mg  for pain since the vicodin makes her sleepy.

## 2020-04-23 LAB — CYTOLOGY - PAP
Comment: NEGATIVE
Diagnosis: NEGATIVE
High risk HPV: NEGATIVE

## 2020-04-25 ENCOUNTER — Other Ambulatory Visit: Payer: Self-pay | Admitting: Obstetrics & Gynecology

## 2020-04-29 ENCOUNTER — Other Ambulatory Visit: Payer: Self-pay | Admitting: Obstetrics & Gynecology

## 2020-04-29 DIAGNOSIS — N938 Other specified abnormal uterine and vaginal bleeding: Secondary | ICD-10-CM

## 2020-04-30 ENCOUNTER — Other Ambulatory Visit: Payer: Self-pay

## 2020-04-30 ENCOUNTER — Ambulatory Visit (INDEPENDENT_AMBULATORY_CARE_PROVIDER_SITE_OTHER): Payer: Medicaid Other | Admitting: Obstetrics & Gynecology

## 2020-04-30 ENCOUNTER — Encounter: Payer: Self-pay | Admitting: Obstetrics & Gynecology

## 2020-04-30 VITALS — BP 120/81 | HR 81 | Ht 68.0 in | Wt 226.0 lb

## 2020-04-30 DIAGNOSIS — D259 Leiomyoma of uterus, unspecified: Secondary | ICD-10-CM | POA: Insufficient documentation

## 2020-04-30 DIAGNOSIS — D250 Submucous leiomyoma of uterus: Secondary | ICD-10-CM | POA: Diagnosis not present

## 2020-04-30 DIAGNOSIS — N938 Other specified abnormal uterine and vaginal bleeding: Secondary | ICD-10-CM | POA: Diagnosis not present

## 2020-04-30 NOTE — Patient Instructions (Signed)
Endometrial Ablation  Endometrial ablation is a procedure that destroys the thin inner layer of the lining of the uterus (endometrium). This procedure may be done:  To stop heavy periods.  To stop bleeding that is causing anemia.  To control irregular bleeding.  To treat bleeding caused by small tumors (fibroids) in the endometrium.  This procedure is often an alternative to major surgery, such as removal of the uterus and cervix (hysterectomy). As a result of this procedure:  You may not be able to have children. However, if you are premenopausal (you have not gone through menopause):  You may still have a small chance of getting pregnant.  You will need to use a reliable method of birth control after the procedure to prevent pregnancy.  You may stop having a menstrual period, or you may have only a small amount of bleeding during your period. Menstruation may return several years after the procedure.  Tell a health care provider about:  Any allergies you have.  All medicines you are taking, including vitamins, herbs, eye drops, creams, and over-the-counter medicines.  Any problems you or family members have had with the use of anesthetic medicines.  Any blood disorders you have.  Any surgeries you have had.  Any medical conditions you have.  What are the risks?  Generally, this is a safe procedure. However, problems may occur, including:  A hole (perforation) in the uterus or bowel.  Infection of the uterus, bladder, or vagina.  Bleeding.  Damage to other structures or organs.  An air bubble in the lung (air embolus).  Problems with pregnancy after the procedure.  Failure of the procedure.  Decreased ability to diagnose cancer in the endometrium.  What happens before the procedure?  You will have tests of your endometrium to make sure there are no pre-cancerous cells or cancer cells present.  You may have an ultrasound of the uterus.  You may be given medicines to thin the endometrium.  Ask your health care  provider about:  Changing or stopping your regular medicines. This is especially important if you take diabetes medicines or blood thinners.  Taking medicines such as aspirin and ibuprofen. These medicines can thin your blood. Do not take these medicines before your procedure if your doctor tells you not to.  Plan to have someone take you home from the hospital or clinic.  What happens during the procedure?    You will lie on an exam table with your feet and legs supported as in a pelvic exam.  To lower your risk of infection:  Your health care team will wash or sanitize their hands and put on germ-free (sterile) gloves.  Your genital area will be washed with soap.  An IV tube will be inserted into one of your veins.  You will be given a medicine to help you relax (sedative).  A surgical instrument with a light and camera (resectoscope) will be inserted into your vagina and moved into your uterus. This allows your surgeon to see inside your uterus.  Endometrial tissue will be removed using one of the following methods:  Radiofrequency. This method uses a radiofrequency-alternating electric current to remove the endometrium.  Cryotherapy. This method uses extreme cold to freeze the endometrium.  Heated-free liquid. This method uses a heated saltwater (saline) solution to remove the endometrium.  Microwave. This method uses high-energy microwaves to heat up the endometrium and remove it.  Thermal balloon. This method involves inserting a catheter with a balloon tip into   the uterus. The balloon tip is filled with heated fluid to remove the endometrium.  The procedure may vary among health care providers and hospitals.  What happens after the procedure?  Your blood pressure, heart rate, breathing rate, and blood oxygen level will be monitored until the medicines you were given have worn off.  As tissue healing occurs, you may notice vaginal bleeding for 4-6 weeks after the procedure. You may also  experience:  Cramps.  Thin, watery vaginal discharge that is light pink or brown in color.  A need to urinate more frequently than usual.  Nausea.  Do not drive for 24 hours if you were given a sedative.  Do not have sex or insert anything into your vagina until your health care provider approves.  Summary  Endometrial ablation is done to treat the many causes of heavy menstrual bleeding.  The procedure may be done only after medications have been tried to control the bleeding.  Plan to have someone take you home from the hospital or clinic.  This information is not intended to replace advice given to you by your health care provider. Make sure you discuss any questions you have with your health care provider.  Document Revised: 11/09/2017 Document Reviewed: 06/11/2016  Elsevier Patient Education  2020 [employer]

## 2020-04-30 NOTE — Progress Notes (Signed)
Patient ID: Shirley Ponce, female   DOB: 04-15-1976, 44 y.o.   MRN: [id number]  No chief complaint on file. DUB  HPI Shirley Ponce is a 44 y.o. female.  [id number] No LMP recorded. (Menstrual status: Irregular Periods). Patient reports vaginal bleeding continues. She did not take the Megace due to concern about hormonal therapy. We discussed this today.  [place] result was reviewed. HPI  Past Medical History:  Diagnosis Date  . Anemia   . Anxiety   . Depression   . Family history of breast cancer   . Family history of colon cancer   . Headache(784.0)   . Migraine headache with aura   . Obesity (BMI 35.0-39.9 without comorbidity)     Past Surgical History:  Procedure Laterality Date  . CESAREAN SECTION  2010  . CHOLECYSTECTOMY    . DILATION AND CURETTAGE OF UTERUS    . WISDOM TOOTH EXTRACTION      Family History  Problem Relation Age of Onset  . Breast cancer Maternal Grandmother 67  . Breast cancer Paternal Grandmother        age dx unk  . Cancer Father 74       lung cancer, hx smoking  . Thyroid disease Sister   . Hypertension Sister   . Breast cancer Mother 35       left breast, dx right breast cancer at 40  . Colon cancer Mother 92  . Breast cancer Other        5 aternal great aunts with breast cancer in 16's    Social History Social History   Tobacco Use  . Smoking status: Never Smoker  . Smokeless tobacco: Never Used  Vaping Use  . Vaping Use: Never used  Substance Use Topics  . Alcohol use: No  . Drug use: No    Allergies  Allergen Reactions  . Latex Hives  . Mushroom Extract Complex Swelling    Current Outpatient Medications  Medication Sig Dispense Refill  . hydrochlorothiazide (HYDRODIURIL) 25 MG tablet Take by mouth.    Marland Kitchen HYDROcodone-acetaminophen (NORCO/VICODIN) 5-325 MG tablet Take 1 tablet by mouth every 4 (four) hours as needed. 10 tablet 0  . ibuprofen (ADVIL) 800 MG tablet Take 1 tablet (800 mg total) by mouth 3 (three) times daily. 21 tablet 0   . megestrol (MEGACE) 40 MG tablet Take 1 tablet (40 mg total) by mouth 2 (two) times daily. 30 tablet 3  . metroNIDAZOLE (FLAGYL) 500 MG tablet Take 1 tablet (500 mg total) by mouth 2 (two) times daily. 14 tablet 0  . Multiple Vitamin (MULTIVITAMIN) tablet Take 1 tablet by mouth daily.    . SUMAtriptan (IMITREX) 100 MG tablet Take 100 mg by mouth every 2 (two) hours as needed for migraine or headache. May repeat in 2 hours if headache persists or recurs.    Marland Kitchen acetaminophen (TYLENOL) 500 MG tablet Take 500 mg by mouth every 6 (six) hours as needed. (Patient not taking: Reported on 04/19/2020)    . aspirin-acetaminophen-caffeine (EXCEDRIN MIGRAINE) 250-250-65 MG per tablet Take 1 tablet by mouth every 6 (six) hours as needed for migraine.     . meloxicam (MOBIC) 7.5 MG tablet Take 1 tablet (7.5 mg total) by mouth daily for 10 days. 10 tablet 0  . potassium chloride SA (KLOR-CON) 20 MEQ tablet Take 1 tablet (20 mEq total) by mouth daily. 3 tablet 0  . thiamine (VITAMIN B-1) 100 MG tablet Take 100 mg by mouth daily. (Patient not taking:  Reported on 04/19/2020)     No current facility-administered medications for this visit.    Review of Systems Review of Systems  Genitourinary: Positive for menstrual problem and vaginal bleeding. Negative for pelvic pain.    Blood pressure 120/81, pulse 81, height 5\' 8"  (1.727 m), weight 226 lb (102.5 kg).  Physical Exam Physical Exam Vitals and nursing note reviewed.  Constitutional:      Appearance: Normal appearance.  Pulmonary:     Effort: Pulmonary effort is normal.  Neurological:     Mental Status: She is alert.  Psychiatric:        Mood and Affect: Mood normal.        Behavior: Behavior normal.     Data Reviewed CLINICAL DATA:  Dysfunctional uterine bleeding  EXAM: TRANSABDOMINAL AND TRANSVAGINAL ULTRASOUND OF PELVIS  DOPPLER ULTRASOUND OF OVARIES  TECHNIQUE: Both transabdominal and transvaginal ultrasound examinations of  the pelvis were performed. Transabdominal technique was performed for global imaging of the pelvis including uterus, ovaries, adnexal regions, and pelvic cul-de-sac.  It was necessary to proceed with endovaginal exam following the transabdominal exam to visualize the uterus, endometrium and ovaries. Color and duplex Doppler ultrasound was utilized to evaluate blood flow to the ovaries.  COMPARISON:  01/21/2010  FINDINGS: Uterus  Measurements: 9.3 x 4.9 x 6.1 cm = volume: 147 mL. There is a submucosal fibroid along the posterior endometrial lining which measures 1.9 x 1.5 x 1.4 cm. It exerts mass-effect on the endometrium.  Endometrium  Thickness: 11 mm. There is a small amount of blood products within endometrium.  Right ovary  Measurements: 3.9 x 2.4 x 3.1 cm = volume: 16 mL. There is an ovarian mass without blood flow which measures 2.2 x 1.9 x 2.0 cm. It demonstrates internal lacy echogenicity and is most consistent with a hemorrhagic cyst.  Left ovary  Measurements: 2.8 x 2.1 x 2.5 cm = volume: 7.8 mL. Normal appearance/no adnexal mass.  Pulsed Doppler evaluation of both ovaries demonstrates normal low-resistance arterial and venous waveforms.  Other findings  Trace free fluid.  IMPRESSION: 1. There is a submucosal 1.9 cm fibroid which exerts mass effect on the endometrium.   Electronically Signed   By: Valentino Saxon MD   On: 04/21/2020 15:25   Assessment DUB (dysfunctional uterine bleeding)  Submucous leiomyoma of uterus    Plan She would prefer surgical management, declined LNGIUD. She will start the Megace to control bleeding. Patient desires surgical management with hysteroscopy and possible myomectomy.  The risks of surgery were discussed in detail with the patient including but not limited to: bleeding which may require transfusion or reoperation; infection which may require prolonged hospitalization or re-hospitalization  and antibiotic therapy; injury to bowel, bladder, ureters and major vessels or other surrounding organs; formation of adhesions; need for additional procedures including laparotomy or subsequent procedures secondary to abnormal pathology; thromboembolic phenomenon; incisional problems and other postoperative or anesthesia complications.  Patient was told that the likelihood that her condition and symptoms will be treated effectively with this surgical management was very high; the postoperative expectations were also discussed in detail. The patient also understands the alternative treatment options which were discussed in full. All questions were answered.  She was told that she will be contacted by our surgical scheduler regarding the time and date of her surgery; routine preoperative instructions will be given to her by the preoperative nursing team.   She is aware of need for preoperative COVID testing and subsequent quarantine from time of  test to time of surgery; she will be given further preoperative instructions at that [facility] screening visit.  Printed patient education handouts about the procedure were given to the patient to review at home.    Emeterio Reeve 04/30/2020, 10:54 AM

## 2020-05-16 ENCOUNTER — Other Ambulatory Visit: Payer: Self-pay

## 2020-05-16 ENCOUNTER — Ambulatory Visit (INDEPENDENT_AMBULATORY_CARE_PROVIDER_SITE_OTHER): Payer: Medicaid Other

## 2020-05-16 ENCOUNTER — Other Ambulatory Visit (HOSPITAL_COMMUNITY)
Admission: RE | Admit: 2020-05-16 | Discharge: 2020-05-16 | Disposition: A | Discharge status: Home / Self Care | Payer: Medicaid Other | Source: Ambulatory Visit | Attending: Obstetrics & Gynecology | Admitting: Obstetrics & Gynecology

## 2020-05-16 VITALS — Wt 224.0 lb

## 2020-05-16 DIAGNOSIS — N898 Other specified noninflammatory disorders of vagina: Secondary | ICD-10-CM | POA: Insufficient documentation

## 2020-05-16 NOTE — Progress Notes (Addendum)
SUBJECTIVE:  44 y.o. female presents for TOC/selfswab for +Trich.  C/o  significant pelvic pain, bleeding from Fibroids. No UTI symptoms. Denies history of known exposure to STD.  No LMP recorded. (Menstrual status: Irregular Periods).  OBJECTIVE:  She appears well, afebrile. Urine dipstick: not done.  ASSESSMENT:  Completed medications Pain and bleeding from Fibroids  PLAN:  GC, chlamydia, trichomonas, BVAG, CVAG probe sent to lab. Treatment: To be determined once lab results are received ROV prn if symptoms persist or worsen.  Patient was assessed and managed by nursing staff during this encounter. I have reviewed the chart and agree with the documentation and plan. I have also made any necessary editorial changes.  Baltazar Najjar, MD 05/16/2020 10:28 AM

## 2020-05-17 LAB — CERVICOVAGINAL ANCILLARY ONLY
Bacterial Vaginitis (gardnerella): NEGATIVE
Chlamydia: NEGATIVE
Comment: NEGATIVE
Comment: NEGATIVE
Comment: NEGATIVE
Comment: NORMAL
Neisseria Gonorrhea: NEGATIVE
Trichomonas: NEGATIVE

## 2020-05-22 ENCOUNTER — Ambulatory Visit: Payer: Medicaid Other | Admitting: Obstetrics & Gynecology

## 2020-06-03 ENCOUNTER — Encounter (HOSPITAL_BASED_OUTPATIENT_CLINIC_OR_DEPARTMENT_OTHER): Payer: Self-pay | Admitting: Obstetrics & Gynecology

## 2020-06-03 ENCOUNTER — Other Ambulatory Visit: Payer: Self-pay

## 2020-06-10 ENCOUNTER — Inpatient Hospital Stay (HOSPITAL_COMMUNITY)
Admission: RE | Admit: 2020-06-10 | Discharge: 2020-06-10 | Disposition: A | Discharge status: Home / Self Care | Payer: Medicaid Other | Source: Ambulatory Visit

## 2020-06-10 NOTE — Progress Notes (Signed)
Patient called stating that both of her children are COVID positive. I called Dr. Olivia Mackie office and they advised may need to reschedule the procedure. Patient also advised her children are really sick and she probably needs to take care of them more than having surgery. Information was communicated to Dr. Olivia Mackie office. I requested patient contact Dr. Olivia Mackie office directly to discuss. I advised her if she tested positive to get a paper copy of result as she would not need to be retested if surgery occurred within 90 days of her positive COVID test.

## 2020-06-18 ENCOUNTER — Other Ambulatory Visit: Payer: Self-pay | Admitting: Obstetrics & Gynecology

## 2020-07-18 ENCOUNTER — Encounter (HOSPITAL_BASED_OUTPATIENT_CLINIC_OR_DEPARTMENT_OTHER): Payer: Self-pay | Admitting: Obstetrics & Gynecology

## 2020-07-18 ENCOUNTER — Other Ambulatory Visit: Payer: Self-pay

## 2020-07-20 ENCOUNTER — Other Ambulatory Visit (HOSPITAL_COMMUNITY): Payer: Medicaid Other

## 2020-07-22 ENCOUNTER — Encounter (HOSPITAL_BASED_OUTPATIENT_CLINIC_OR_DEPARTMENT_OTHER)
Admission: RE | Admit: 2020-07-22 | Discharge: 2020-07-22 | Disposition: A | Discharge status: Home / Self Care | Payer: Medicaid Other | Source: Ambulatory Visit | Attending: Obstetrics & Gynecology | Admitting: Obstetrics & Gynecology

## 2020-07-22 ENCOUNTER — Other Ambulatory Visit (HOSPITAL_COMMUNITY)
Admission: RE | Admit: 2020-07-22 | Discharge: 2020-07-22 | Disposition: A | Discharge status: Home / Self Care | Payer: Medicaid Other | Source: Ambulatory Visit | Attending: Obstetrics & Gynecology | Admitting: Obstetrics & Gynecology

## 2020-07-22 ENCOUNTER — Other Ambulatory Visit: Payer: Self-pay

## 2020-07-22 DIAGNOSIS — Z803 Family history of malignant neoplasm of breast: Secondary | ICD-10-CM | POA: Diagnosis not present

## 2020-07-22 DIAGNOSIS — Z20822 Contact with and (suspected) exposure to covid-19: Secondary | ICD-10-CM | POA: Diagnosis not present

## 2020-07-22 DIAGNOSIS — Z01818 Encounter for other preprocedural examination: Secondary | ICD-10-CM | POA: Insufficient documentation

## 2020-07-22 DIAGNOSIS — Z91018 Allergy to other foods: Secondary | ICD-10-CM | POA: Diagnosis not present

## 2020-07-22 DIAGNOSIS — N938 Other specified abnormal uterine and vaginal bleeding: Secondary | ICD-10-CM | POA: Diagnosis not present

## 2020-07-22 DIAGNOSIS — Z01812 Encounter for preprocedural laboratory examination: Secondary | ICD-10-CM | POA: Diagnosis present

## 2020-07-22 DIAGNOSIS — Z801 Family history of malignant neoplasm of trachea, bronchus and lung: Secondary | ICD-10-CM | POA: Diagnosis not present

## 2020-07-22 DIAGNOSIS — Z800 Family history of malignant neoplasm of digestive organs: Secondary | ICD-10-CM | POA: Diagnosis not present

## 2020-07-22 DIAGNOSIS — Z8249 Family history of ischemic heart disease and other diseases of the circulatory system: Secondary | ICD-10-CM | POA: Diagnosis not present

## 2020-07-22 DIAGNOSIS — Z791 Long term (current) use of non-steroidal anti-inflammatories (NSAID): Secondary | ICD-10-CM | POA: Diagnosis not present

## 2020-07-22 DIAGNOSIS — Z7982 Long term (current) use of aspirin: Secondary | ICD-10-CM | POA: Diagnosis not present

## 2020-07-22 DIAGNOSIS — Z8349 Family history of other endocrine, nutritional and metabolic diseases: Secondary | ICD-10-CM | POA: Diagnosis not present

## 2020-07-22 DIAGNOSIS — Z79899 Other long term (current) drug therapy: Secondary | ICD-10-CM | POA: Diagnosis not present

## 2020-07-22 DIAGNOSIS — Z91040 Latex allergy status: Secondary | ICD-10-CM | POA: Diagnosis not present

## 2020-07-22 DIAGNOSIS — D250 Submucous leiomyoma of uterus: Secondary | ICD-10-CM | POA: Diagnosis not present

## 2020-07-22 LAB — BASIC METABOLIC PANEL
Anion gap: 8 (ref 5–15)
BUN: 6 mg/dL (ref 6–20)
CO2: 25 mmol/L (ref 22–32)
Calcium: 8.9 mg/dL (ref 8.9–10.3)
Chloride: 105 mmol/L (ref 98–111)
Creatinine, Ser: 0.88 mg/dL (ref 0.44–1.00)
GFR, Estimated: >60 mL/min (ref >60)
Glucose, Bld: 65 mg/dL — ABNORMAL LOW (ref 70–99)
Potassium: 3.2 mmol/L — ABNORMAL LOW (ref 3.5–5.1)
Sodium: 138 mmol/L (ref 135–145)

## 2020-07-22 LAB — POCT PREGNANCY, URINE: Preg Test, Ur: NEGATIVE

## 2020-07-22 NOTE — Progress Notes (Signed)

## 2020-07-23 LAB — SARS CORONAVIRUS 2 (TAT 6-24 HRS): SARS Coronavirus 2: NEGATIVE

## 2020-07-23 LAB — TYPE AND SCREEN
ABO/RH(D): O POS
Antibody Screen: NEGATIVE

## 2020-07-24 ENCOUNTER — Ambulatory Visit (HOSPITAL_BASED_OUTPATIENT_CLINIC_OR_DEPARTMENT_OTHER): Payer: Medicaid Other | Admitting: Certified Registered Nurse Anesthetist

## 2020-07-24 ENCOUNTER — Ambulatory Visit (HOSPITAL_BASED_OUTPATIENT_CLINIC_OR_DEPARTMENT_OTHER)
Admission: RE | Admit: 2020-07-24 | Discharge: 2020-07-24 | Disposition: A | Discharge status: Home / Self Care | Payer: Medicaid Other | Attending: Obstetrics & Gynecology | Admitting: Obstetrics & Gynecology

## 2020-07-24 ENCOUNTER — Other Ambulatory Visit: Payer: Self-pay

## 2020-07-24 ENCOUNTER — Encounter (HOSPITAL_BASED_OUTPATIENT_CLINIC_OR_DEPARTMENT_OTHER): Payer: Self-pay | Admitting: Obstetrics & Gynecology

## 2020-07-24 ENCOUNTER — Encounter (HOSPITAL_BASED_OUTPATIENT_CLINIC_OR_DEPARTMENT_OTHER)
Admission: RE | Disposition: A | Discharge status: Home / Self Care | Payer: Self-pay | Source: Home / Self Care | Attending: Obstetrics & Gynecology

## 2020-07-24 DIAGNOSIS — Z8249 Family history of ischemic heart disease and other diseases of the circulatory system: Secondary | ICD-10-CM | POA: Insufficient documentation

## 2020-07-24 DIAGNOSIS — N938 Other specified abnormal uterine and vaginal bleeding: Secondary | ICD-10-CM | POA: Diagnosis not present

## 2020-07-24 DIAGNOSIS — Z91040 Latex allergy status: Secondary | ICD-10-CM | POA: Insufficient documentation

## 2020-07-24 DIAGNOSIS — Z803 Family history of malignant neoplasm of breast: Secondary | ICD-10-CM | POA: Diagnosis not present

## 2020-07-24 DIAGNOSIS — Z800 Family history of malignant neoplasm of digestive organs: Secondary | ICD-10-CM | POA: Insufficient documentation

## 2020-07-24 DIAGNOSIS — Z791 Long term (current) use of non-steroidal anti-inflammatories (NSAID): Secondary | ICD-10-CM | POA: Insufficient documentation

## 2020-07-24 DIAGNOSIS — D250 Submucous leiomyoma of uterus: Secondary | ICD-10-CM | POA: Insufficient documentation

## 2020-07-24 DIAGNOSIS — Z8349 Family history of other endocrine, nutritional and metabolic diseases: Secondary | ICD-10-CM | POA: Insufficient documentation

## 2020-07-24 DIAGNOSIS — Z801 Family history of malignant neoplasm of trachea, bronchus and lung: Secondary | ICD-10-CM | POA: Insufficient documentation

## 2020-07-24 DIAGNOSIS — Z91018 Allergy to other foods: Secondary | ICD-10-CM | POA: Insufficient documentation

## 2020-07-24 DIAGNOSIS — Z79899 Other long term (current) drug therapy: Secondary | ICD-10-CM | POA: Insufficient documentation

## 2020-07-24 DIAGNOSIS — D259 Leiomyoma of uterus, unspecified: Secondary | ICD-10-CM | POA: Diagnosis present

## 2020-07-24 DIAGNOSIS — Z7982 Long term (current) use of aspirin: Secondary | ICD-10-CM | POA: Insufficient documentation

## 2020-07-24 HISTORY — PX: DILATATION & CURETTAGE/HYSTEROSCOPY WITH MYOSURE: SHX6511

## 2020-07-24 SURGERY — DILATATION & CURETTAGE/HYSTEROSCOPY WITH MYOSURE
Anesthesia: General

## 2020-07-24 MED ORDER — FENTANYL CITRATE (PF) 100 MCG/2ML IJ SOLN
INTRAMUSCULAR | Status: AC
  Filled 2020-07-24: qty 2

## 2020-07-24 MED ORDER — SODIUM CHLORIDE 0.9 % IR SOLN
Status: DC | PRN
  Administered 2020-07-24: 1000 mL

## 2020-07-24 MED ORDER — DEXMEDETOMIDINE (PRECEDEX) IN NS 20 MCG/5ML (4 MCG/ML) IV SYRINGE
PREFILLED_SYRINGE | INTRAVENOUS | Status: AC
  Filled 2020-07-24: qty 10

## 2020-07-24 MED ORDER — OXYCODONE HCL 5 MG PO TABS
5.0000 mg | ORAL_TABLET | Freq: Once | ORAL | Status: DC | PRN
Start: 2020-07-24 — End: 2020-07-24

## 2020-07-24 MED ORDER — OXYCODONE HCL 5 MG/5ML PO SOLN: 5.0000 mg | Freq: Once | ORAL | Status: DC | PRN

## 2020-07-24 MED ORDER — BUPIVACAINE HCL (PF) 0.5 % IJ SOLN
INTRAMUSCULAR | Status: AC
  Filled 2020-07-24: qty 30

## 2020-07-24 MED ORDER — DEXMEDETOMIDINE (PRECEDEX) IN NS 20 MCG/5ML (4 MCG/ML) IV SYRINGE
PREFILLED_SYRINGE | INTRAVENOUS | Status: DC | PRN
  Administered 2020-07-24 (×5): 8 ug via INTRAVENOUS

## 2020-07-24 MED ORDER — FENTANYL CITRATE (PF) 100 MCG/2ML IJ SOLN: 25.0000 ug | INTRAMUSCULAR | Status: DC | PRN

## 2020-07-24 MED ORDER — POVIDONE-IODINE 10 % EX SWAB
2.0000 "application " | Freq: Once | CUTANEOUS | Status: AC
  Administered 2020-07-24: 2 via TOPICAL

## 2020-07-24 MED ORDER — MIDAZOLAM HCL 2 MG/2ML IJ SOLN
INTRAMUSCULAR | Status: DC | PRN
  Administered 2020-07-24: 2 mg via INTRAVENOUS

## 2020-07-24 MED ORDER — LACTATED RINGERS IV SOLN: INTRAVENOUS | Status: DC

## 2020-07-24 MED ORDER — BUPIVACAINE HCL (PF) 0.5 % IJ SOLN
INTRAMUSCULAR | Status: DC | PRN
  Administered 2020-07-24: 10 mL

## 2020-07-24 MED ORDER — OXYCODONE-ACETAMINOPHEN 5-325 MG PO TABS: 1.0000 | ORAL_TABLET | Freq: Four times a day (QID) | ORAL | 0 refills | Status: DC | PRN

## 2020-07-24 MED ORDER — PHENYLEPHRINE 40 MCG/ML (10ML) SYRINGE FOR IV PUSH (FOR BLOOD PRESSURE SUPPORT)
PREFILLED_SYRINGE | INTRAVENOUS | Status: AC
  Filled 2020-07-24: qty 10

## 2020-07-24 MED ORDER — ONDANSETRON HCL 4 MG/2ML IJ SOLN
INTRAMUSCULAR | Status: DC | PRN
  Administered 2020-07-24: 4 mg via INTRAVENOUS

## 2020-07-24 MED ORDER — ARTIFICIAL TEARS OPHTHALMIC OINT
TOPICAL_OINTMENT | OPHTHALMIC | Status: AC
  Filled 2020-07-24: qty 3.5

## 2020-07-24 MED ORDER — PHENYLEPHRINE HCL (PRESSORS) 10 MG/ML IV SOLN
INTRAVENOUS | Status: DC | PRN
  Administered 2020-07-24 (×2): 120 ug via INTRAVENOUS
  Administered 2020-07-24 (×2): 80 ug via INTRAVENOUS

## 2020-07-24 MED ORDER — MIDAZOLAM HCL 2 MG/2ML IJ SOLN
INTRAMUSCULAR | Status: AC
  Filled 2020-07-24: qty 2

## 2020-07-24 MED ORDER — PROPOFOL 10 MG/ML IV BOLUS
INTRAVENOUS | Status: AC
  Filled 2020-07-24: qty 20

## 2020-07-24 MED ORDER — FENTANYL CITRATE (PF) 100 MCG/2ML IJ SOLN
INTRAMUSCULAR | Status: DC | PRN
  Administered 2020-07-24 (×2): 50 ug via INTRAVENOUS

## 2020-07-24 MED ORDER — DEXAMETHASONE SODIUM PHOSPHATE 10 MG/ML IJ SOLN
INTRAMUSCULAR | Status: DC | PRN
  Administered 2020-07-24: 5 mg via INTRAVENOUS

## 2020-07-24 MED ORDER — ONDANSETRON HCL 4 MG/2ML IJ SOLN
INTRAMUSCULAR | Status: AC
  Filled 2020-07-24: qty 2

## 2020-07-24 MED ORDER — LIDOCAINE 2% (20 MG/ML) 5 ML SYRINGE
INTRAMUSCULAR | Status: AC
  Filled 2020-07-24: qty 5

## 2020-07-24 MED ORDER — GLYCOPYRROLATE 0.2 MG/ML IJ SOLN
INTRAMUSCULAR | Status: DC | PRN
  Administered 2020-07-24: .2 mg via INTRAVENOUS

## 2020-07-24 MED ORDER — DEXAMETHASONE SODIUM PHOSPHATE 10 MG/ML IJ SOLN
INTRAMUSCULAR | Status: AC
  Filled 2020-07-24: qty 1

## 2020-07-24 MED ORDER — ONDANSETRON HCL 4 MG/2ML IJ SOLN
4.0000 mg | Freq: Four times a day (QID) | INTRAMUSCULAR | Status: AC | PRN
  Administered 2020-07-24: 4 mg via INTRAVENOUS

## 2020-07-24 MED ORDER — ARTIFICIAL TEARS OPHTHALMIC OINT
TOPICAL_OINTMENT | OPHTHALMIC | Status: DC | PRN
  Administered 2020-07-24: 1 via OPHTHALMIC

## 2020-07-24 MED ORDER — PROPOFOL 10 MG/ML IV BOLUS
INTRAVENOUS | Status: DC | PRN
  Administered 2020-07-24: 200 mg via INTRAVENOUS

## 2020-07-24 SURGICAL SUPPLY — 18 items
CANISTER SUCT 1200ML W/VALVE (MISCELLANEOUS) ×2 IMPLANT
CATH ROBINSON RED A/P 16FR (CATHETERS) ×2 IMPLANT
DEVICE MYOSURE LITE (MISCELLANEOUS) IMPLANT
DEVICE MYOSURE REACH (MISCELLANEOUS) IMPLANT
GAUZE 4X4 16PLY RFD (DISPOSABLE) ×2 IMPLANT
GLOVE SURG UNDER POLY LF SZ7 (GLOVE) ×4 IMPLANT
GOWN STRL REUS W/ TWL LRG LVL3 (GOWN DISPOSABLE) ×2 IMPLANT
GOWN STRL REUS W/TWL LRG LVL3 (GOWN DISPOSABLE) ×4
KIT PROCEDURE FLUENT (KITS) ×2 IMPLANT
KIT TURNOVER KIT B (KITS) ×2 IMPLANT
MYOSURE XL FIBROID (MISCELLANEOUS) ×2
PACK VAGINAL MINOR WOMEN LF (CUSTOM PROCEDURE TRAY) ×2 IMPLANT
PAD OB MATERNITY 4.3X12.25 (PERSONAL CARE ITEMS) ×2 IMPLANT
PAD PREP 24X48 CUFFED NSTRL (MISCELLANEOUS) ×4 IMPLANT
SEAL ROD LENS SCOPE MYOSURE (ABLATOR) ×2 IMPLANT
SLEEVE SCD COMPRESS KNEE MED (MISCELLANEOUS) ×2 IMPLANT
SYSTEM TISS REMOVAL MYOSURE XL (MISCELLANEOUS) IMPLANT
TOWEL GREEN STERILE FF (TOWEL DISPOSABLE) ×4 IMPLANT

## 2020-07-24 NOTE — Discharge Instructions (Signed)
Hysteroscopy Hysteroscopy is a procedure used to look inside a woman's womb (uterus). This may be done for various reasons, including:  To look for tumors and other growths in the uterus.  To evaluate abnormal bleeding, fibroid tumors, polyps, scar tissue, or uterine cancer.  To determine why a woman is unable to get pregnant or has had repeated pregnancy losses.  To locate an IUD (intrauterine device).  To place a birth control device into the fallopian tubes. During this procedure, a thin, flexible tube with a small light and camera (hysteroscope) is used to examine the uterus. The camera sends images to a monitor in the room so that your health care provider can view the inside of your uterus. A hysteroscopy should be done right after a menstrual period. Tell a health care provider about:  Any allergies you have.  All medicines you are taking, including vitamins, herbs, eye drops, creams, and over-the-counter medicines.  Any problems you or family members have had with anesthetic medicines.  Any blood disorders you have.  Any surgeries you have had.  Any medical conditions you have.  Whether you are pregnant or may be pregnant.  Whether you have been diagnosed with an STI (sexually transmitted infection) or you think you have an STI. What are the risks? Generally, this is a safe procedure. However, problems may occur, including:  Excessive bleeding.  Infection.  Damage to the uterus or other structures or organs.  Allergic reaction to medicines or fluids that are used in the procedure. What happens before the procedure? Staying hydrated Follow instructions from your health care provider about hydration, which may include:  Up to 2 hours before the procedure - you may continue to drink clear liquids, such as water, clear fruit juice, black coffee, and plain tea. Eating and drinking restrictions Follow instructions from your health care provider about eating and  drinking, which may include:  8 hours before the procedure - stop eating solid foods and drink clear liquids only.  2 hours before the procedure - stop drinking clear liquids. Medicines  Ask your health care provider about: ? Changing or stopping your regular medicines. This is especially important if you are taking diabetes medicines or blood thinners. ? Taking medicines such as aspirin and ibuprofen. These medicines can thin your blood. Do not take these medicines unless your health care provider tells you to take them. ? Taking over-the-counter medicines, vitamins, herbs, and supplements.  Medicine may be placed in your cervix the day before the procedure. This medicine causes the cervix to open (dilate). The larger opening makes it easier for the hysteroscope to be inserted into the uterus during the procedure. General instructions  Ask your health care provider: ? What steps will be taken to help prevent infection. These steps may include:  Washing skin with a germ-killing soap.  Taking antibiotic medicine.  Do not use any products that contain nicotine or tobacco for at least 4 weeks before the procedure. These products include cigarettes, chewing tobacco, and vaping devices, such as e-cigarettes. If you need help quitting, ask your health care provider.  Plan to have a responsible adult take you home from the hospital or clinic.  Plan to have a responsible adult care for you for the time you are told after you leave the hospital or clinic. This is important.  Empty your bladder before the procedure begins. What happens during the procedure?  An IV will be inserted into one of your veins.  You may be given: ?   A medicine to help you relax (sedative). ? A medicine that numbs the area around the cervix (local anesthetic). ? A medicine to make you fall asleep (general anesthetic).  A hysteroscope will be inserted through your vagina and into your uterus.  Air or fluid will  be used to enlarge your uterus to allow your health care provider to see it better. The amount of fluid used will be carefully checked throughout the procedure.  In some cases, tissue may be gently scraped from inside the uterus and sent to a lab for testing (biopsy). The procedure may vary among health care providers and hospitals. What can I expect after the procedure?  Your blood pressure, heart rate, breathing rate, and blood oxygen level will be monitored until you leave the hospital or clinic.  You may have cramps. You may be given medicines for this.  You may have bleeding, which may vary from light spotting to menstrual-like bleeding. This is normal.  If you had a biopsy, it is up to you to get the results. Ask your health care provider, or the department that is doing the procedure, when your results will be ready. Follow these instructions at home: Activity  Rest as told by your health care provider.  Return to your normal activities as told by your health care provider. Ask your health care provider what activities are safe for you.  If you were given a sedative during the procedure, it can affect you for several hours. Do not drive or operate machinery until your health care provider says that it is safe. Medicines  Do not take aspirin or other NSAIDs during recovery, as told by your healthcare provider. It can increase the risk of bleeding.  Ask your health care provider if the medicine prescribed to you: ? Requires you to avoid driving or using machinery. ? Can cause constipation. You may need to take these actions to prevent or treat constipation:  Drink enough fluid to keep your urine pale yellow.  Take over-the-counter or prescription medicines.  Eat foods that are high in fiber, such as beans, whole grains, and fresh fruits and vegetables.  Limit foods that are high in fat and processed sugars, such as fried or sweet foods. General instructions  Do not douche,  use tampons, or have sex for 2 weeks after the procedure, or until your health care provider approves.  Do not take baths, swim, or use a hot tub until your health care provider approves. Take showers instead of baths for 2 weeks, or for as long as told by your health care provider.  Keep all follow-up visits. This is important. Contact a health care provider if:  You feel dizzy or lightheaded.  You feel nauseous.  You have abnormal vaginal discharge.  You have a rash.  You have pain that does not get better with medicine.  You have chills. Get help right away if:  You have bleeding that is heavier than a normal menstrual period.  You have a fever.  You have pain or cramps that get worse.  You develop new abdominal pain.  You faint.  You have pain in your shoulder.  You are short of breath. Summary  Hysteroscopy is a procedure that is used to look inside a woman's womb (uterus).  After the procedure, you may have bleeding, which varies from light spotting to menstrual-like bleeding. This is normal. You may also have cramps.  Do not douche, use tampons, or have sex for 2 weeks after   the procedure, or until your health care provider approves.  Plan to have a responsible adult take you home from the hospital or clinic. This information is not intended to replace advice given to you by your health care provider. Make sure you discuss any questions you have with your health care provider. Document Revised: 01/10/2020 Document Reviewed: 01/10/2020 Elsevier Patient Education  2021 [employer]    Post Anesthesia Home Care Instructions  Activity: Get plenty of rest for the remainder of the day. A responsible individual must stay with you for 24 hours following the procedure.  For the next 24 hours, DO NOT: -Drive a car -Operate machinery -Drink alcoholic beverages -Take any medication unless instructed by your physician -Make any legal decisions or sign important  papers.  Meals: Start with liquid foods such as gelatin or soup. Progress to regular foods as tolerated. Avoid greasy, spicy, heavy foods. If nausea and/or vomiting occur, drink only clear liquids until the nausea and/or vomiting subsides. Call your physician if vomiting continues.  Special Instructions/Symptoms: Your throat may feel dry or sore from the anesthesia or the breathing tube placed in your throat during surgery. If this causes discomfort, gargle with warm salt water. The discomfort should disappear within 24 hours.  If you had a scopolamine patch placed behind your ear for the management of post- operative nausea and/or vomiting:  1. The medication in the patch is effective for 72 hours, after which it should be removed.  Wrap patch in a tissue and discard in the trash. Wash hands thoroughly with soap and water. 2. You may remove the patch earlier than 72 hours if you experience unpleasant side effects which may include dry mouth, dizziness or visual disturbances. 3. Avoid touching the patch. Wash your hands with soap and water after contact with the patch.      

## 2020-07-24 NOTE — Transfer of Care (Signed)
Immediate Anesthesia Transfer of Care Note  Patient: Shirley Ponce  Procedure(s) Performed: DILATATION & CURETTAGE/HYSTEROSCOPY WITH MYOSURE AND VAGINAL MYOMECTOMY (N/A )  Patient Location: PACU  Anesthesia Type:General  Level of Consciousness: awake, alert  and patient cooperative  Airway & Oxygen Therapy: Patient Spontanous Breathing and Patient connected to face mask oxygen  Post-op Assessment: Report given to RN, Post -op Vital signs reviewed and stable, Patient moving all extremities X 4 and Patient able to stick tongue midline  Post vital signs: Reviewed and stable  Last Vitals:  Vitals Value Taken Time  BP 127/79 07/24/20 1226  Temp    Pulse 74 07/24/20 1227  Resp 15 07/24/20 1227  SpO2 100 % 07/24/20 1227  Vitals shown include unvalidated device data.  Last Pain:  Vitals:   07/24/20 1003  TempSrc: Oral  PainSc: 0-No pain      Patients Stated Pain Goal: 4 (07/68/08 8110)  Complications: No complications documented.

## 2020-07-24 NOTE — Op Note (Signed)
PREOPERATIVE DIAGNOSIS:  Abnormal uterine bleeding, submucosal fibroid POSTOPERATIVE DIAGNOSIS: The same PROCEDURE: Hysteroscopy, myomectomy with MyoSure SURGEON:  Dr. Emeterio Reeve   INDICATIONS: 45 y.o. [id number]  here for scheduled surgery for the aforementioned diagnoses.   Risks of surgery were discussed with the patient including but not limited to: bleeding which may require transfusion; infection which may require antibiotics; injury to uterus or surrounding organs; intrauterine scarring which may impair future fertility; need for additional procedures including laparotomy or laparoscopy; and other postoperative/anesthesia complications. Written informed consent was obtained.    FINDINGS:  A 6 week size uterus. Normal endometrium.  Normal ostia bilaterally, posterior wall submucosal fibroid  ANESTHESIA:   General, paracervical block with 30 ml of 0.5% Marcaine INTRAVENOUS FLUIDS:  1500 ml of LR FLUID DEFICITS:  272ml of saline ESTIMATED BLOOD LOSS:  Less than 20 ml SPECIMENS: leiomyoma curettings sent to pathology COMPLICATIONS:  None immediate.  PROCEDURE DETAILS:  The patient was taken to the operating room where general anesthesia was administered and was found to be adequate.  After an adequate timeout was performed, she was placed in the dorsal lithotomy position and examined; then prepped and draped in the sterile manner.   Her bladder was catheterized for an unmeasured amount of clear, yellow urine. A speculum was then placed in the patient's vagina and a single tooth tenaculum was applied to the anterior lip of the cervix.   A paracervical block using 10 ml of 0.5% Marcaine was administered.  The cervix was dilated manually with metal dilators to accommodate the MyoSure hysteroscope.  Once the cervix was dilated, the hysteroscope was inserted under direct visualization using saline as a suspension medium.  The uterine cavity was carefully examined with the findings as noted above. The  MyoSure device was use to resect the 2-3 cm fibroid until the base was flush with the cavity   After further careful visualization of the uterine cavity, the hysteroscope was removed under direct visualization. The tenaculum was removed from the anterior lip of the cervix and the vaginal speculum was removed after noting good hemostasis.  The patient tolerated the procedure well and was taken to the recovery area awake, extubated and in stable condition.  The patient will be discharged to home as per PACU criteria.  Routine postoperative instructions given.  She was prescribed Percocet.  She will follow up in the clinic in 3 weeks  for postoperative evaluation.  Woodroe Mode, MD 07/24/2020

## 2020-07-24 NOTE — H&P (Signed)
No chief complaint on file. Shirley Ponce is a 45 y.o. female.  [id number] No LMP recorded. (Menstrual status: Irregular Periods). Patient is scheduled for hysteroscopy and resection of submucosal fibroid seen on ultrasound.  HPI      Past Medical History:  Diagnosis Date  . Anemia   . Anxiety   . Depression   . Family history of breast cancer   . Family history of colon cancer   . Headache(784.0)   . Migraine headache with aura   . Obesity (BMI 35.0-39.9 without comorbidity)          Past Surgical History:  Procedure Laterality Date  . CESAREAN SECTION  2010  . CHOLECYSTECTOMY    . DILATION AND CURETTAGE OF UTERUS    . WISDOM TOOTH EXTRACTION           Family History  Problem Relation Age of Onset  . Breast cancer Maternal Grandmother 58  . Breast cancer Paternal Grandmother        age dx unk  . Cancer Father 62       lung cancer, hx smoking  . Thyroid disease Sister   . Hypertension Sister   . Breast cancer Mother 61       left breast, dx right breast cancer at 70  . Colon cancer Mother 29  . Breast cancer Other        5 aternal great aunts with breast cancer in 29's    Social History Social History       Tobacco Use  . Smoking status: Never Smoker  . Smokeless tobacco: Never Used  Vaping Use  . Vaping Use: Never used  Substance Use Topics  . Alcohol use: No  . Drug use: No        Allergies  Allergen Reactions  . Latex Hives  . Mushroom Extract Complex Swelling          Current Outpatient Medications  Medication Sig Dispense Refill  . hydrochlorothiazide (HYDRODIURIL) 25 MG tablet Take by mouth.    Marland Kitchen HYDROcodone-acetaminophen (NORCO/VICODIN) 5-325 MG tablet Take 1 tablet by mouth every 4 (four) hours as needed. 10 tablet 0  . ibuprofen (ADVIL) 800 MG tablet Take 1 tablet (800 mg total) by mouth 3 (three) times daily. 21 tablet 0  . megestrol (MEGACE) 40 MG tablet Take 1 tablet (40 mg total) by  mouth 2 (two) times daily. 30 tablet 3  . metroNIDAZOLE (FLAGYL) 500 MG tablet Take 1 tablet (500 mg total) by mouth 2 (two) times daily. 14 tablet 0  . Multiple Vitamin (MULTIVITAMIN) tablet Take 1 tablet by mouth daily.    . SUMAtriptan (IMITREX) 100 MG tablet Take 100 mg by mouth every 2 (two) hours as needed for migraine or headache. May repeat in 2 hours if headache persists or recurs.    Marland Kitchen acetaminophen (TYLENOL) 500 MG tablet Take 500 mg by mouth every 6 (six) hours as needed. (Patient not taking: Reported on 04/19/2020)    . aspirin-acetaminophen-caffeine (EXCEDRIN MIGRAINE) 250-250-65 MG per tablet Take 1 tablet by mouth every 6 (six) hours as needed for migraine.     . meloxicam (MOBIC) 7.5 MG tablet Take 1 tablet (7.5 mg total) by mouth daily for 10 days. 10 tablet 0  . potassium chloride SA (KLOR-CON) 20 MEQ tablet Take 1 tablet (20 mEq total) by mouth daily. 3 tablet 0  . thiamine (VITAMIN B-1) 100 MG tablet Take 100 mg by mouth daily. (Patient not taking: Reported  on 04/19/2020)     No current facility-administered medications for this visit.    Review of Systems Review of Systems  Genitourinary: Positive for menstrual problem and vaginal bleeding. Negative for pelvic pain.    Blood pressure 115/74, pulse 85, temperature 98.1 F (36.7 C), temperature source Oral, resp. rate 18, height 5' 8.5" (1.74 m), weight 107 kg, last menstrual period 07/18/2020, SpO2 100 %.   Physical Exam Physical Exam Vitals and nursing note reviewed.  Constitutional:      Appearance: Normal appearance.  Pulmonary:     Effort: Pulmonary effort is normal.  Neurological:     Mental Status: She is alert.  Psychiatric:        Mood and Affect: Mood normal.        Behavior: Behavior normal.     Data Reviewed CLINICAL DATA: Dysfunctional uterine bleeding  EXAM: TRANSABDOMINAL AND TRANSVAGINAL ULTRASOUND OF PELVIS  DOPPLER ULTRASOUND OF OVARIES  TECHNIQUE: Both  transabdominal and transvaginal ultrasound examinations of the pelvis were performed. Transabdominal technique was performed for global imaging of the pelvis including uterus, ovaries, adnexal regions, and pelvic cul-de-sac.  It was necessary to proceed with endovaginal exam following the transabdominal exam to visualize the uterus, endometrium and ovaries. Color and duplex Doppler ultrasound was utilized to evaluate blood flow to the ovaries.  COMPARISON: 01/21/2010  FINDINGS: Uterus  Measurements: 9.3 x 4.9 x 6.1 cm = volume: 147 mL. There is a submucosal fibroid along the posterior endometrial lining which measures 1.9 x 1.5 x 1.4 cm. It exerts mass-effect on the endometrium.  Endometrium  Thickness: 11 mm. There is a small amount of blood products within endometrium.  Right ovary  Measurements: 3.9 x 2.4 x 3.1 cm = volume: 16 mL. There is an ovarian mass without blood flow which measures 2.2 x 1.9 x 2.0 cm. It demonstrates internal lacy echogenicity and is most consistent with a hemorrhagic cyst.  Left ovary  Measurements: 2.8 x 2.1 x 2.5 cm = volume: 7.8 mL. Normal appearance/no adnexal mass.  Pulsed Doppler evaluation of both ovaries demonstrates normal low-resistance arterial and venous waveforms.  Other findings  Trace free fluid.  IMPRESSION: 1. There is a submucosal 1.9 cm fibroid which exerts mass effect on the endometrium.   Electronically Signed By: Valentino Saxon MD On: 04/21/2020 15:25   Assessment Shirley (dysfunctional uterine bleeding)  Submucous leiomyoma of uterus    Plan She would prefer surgical management, declined LNGIUD. She will start the Megace to control bleeding. Patient desires surgical management with hysteroscopy and possible myomectomy.  The risks of surgery were discussed in detail with the patient including but not limited to: bleeding which may require transfusion or reoperation; infection  which may require prolonged hospitalization or re-hospitalization and antibiotic therapy; injury to bowel, bladder, ureters and major vessels or other surrounding organs; formation of adhesions;need for additional procedures including laparotomy or subsequent procedures secondary to abnormal pathology; thromboembolic phenomenon; incisional problems and other postoperative or anesthesia complications.  Patient was told that the likelihood that her condition and symptoms will be treated effectively with this surgical management was very high; the postoperative expectations were also discussed in detail. The patient also understands the alternative treatment options which were discussed in full. All questions were answered.    Woodroe Mode, MD 07/24/2020

## 2020-07-24 NOTE — Anesthesia Procedure Notes (Signed)
Procedure Name: LMA Insertion Date/Time: 07/24/2020 11:34 AM Performed by: Collier Bullock, CRNA Pre-anesthesia Checklist: Patient identified, Emergency Drugs available, Suction available and Patient being monitored Patient Re-evaluated:Patient Re-evaluated prior to induction Oxygen Delivery Method: Circle system utilized Preoxygenation: Pre-oxygenation with 100% oxygen Induction Type: IV induction LMA: LMA inserted LMA Size: 4.0 Number of attempts: 1 Placement Confirmation: positive ETCO2 and breath sounds checked- equal and bilateral Tube secured with: Tape Dental Injury: Teeth and Oropharynx as per pre-operative assessment

## 2020-07-24 NOTE — Anesthesia Preprocedure Evaluation (Signed)
Anesthesia Evaluation  Patient identified by MRN, date of birth, ID band Patient awake    Reviewed: Allergy & Precautions, H&P , NPO status , Patient's Chart, lab work & pertinent test results  Airway Mallampati: II   Neck ROM: full    Dental   Pulmonary neg pulmonary ROS,    breath sounds clear to auscultation       Cardiovascular negative cardio ROS   Rhythm:regular Rate:Normal     Neuro/Psych  Headaches, PSYCHIATRIC DISORDERS Anxiety Depression    GI/Hepatic   Endo/Other    Renal/GU      Musculoskeletal   Abdominal   Peds  Hematology   Anesthesia Other Findings   Reproductive/Obstetrics                             Anesthesia Physical Anesthesia Plan  ASA: II  Anesthesia Plan: General   Post-op Pain Management:    Induction: Intravenous  PONV Risk Score and Plan: 3 and Ondansetron, Dexamethasone, Midazolam and Treatment may vary due to age or medical condition  Airway Management Planned: LMA  Additional Equipment:   Intra-op Plan:   Post-operative Plan: Extubation in OR  Informed Consent: I have reviewed the patients History and Physical, chart, labs and discussed the procedure including the risks, benefits and alternatives for the proposed anesthesia with the patient or authorized representative who has indicated his/her understanding and acceptance.     Dental advisory given  Plan Discussed with: CRNA, Anesthesiologist and Surgeon  Anesthesia Plan Comments:         Anesthesia Quick Evaluation

## 2020-07-24 NOTE — Anesthesia Postprocedure Evaluation (Signed)
Anesthesia Post Note  Patient: Shirley Ponce  Procedure(s) Performed: DILATATION & CURETTAGE/HYSTEROSCOPY WITH MYOSURE AND VAGINAL MYOMECTOMY (N/A )     Patient location during evaluation: PACU Anesthesia Type: General Level of consciousness: awake and alert Pain management: pain level controlled Vital Signs Assessment: post-procedure vital signs reviewed and stable Respiratory status: spontaneous breathing, nonlabored ventilation, respiratory function stable and patient connected to nasal cannula oxygen Cardiovascular status: blood pressure returned to baseline and stable Postop Assessment: no apparent nausea or vomiting Anesthetic complications: no   No complications documented.  Last Vitals:  Vitals:   07/24/20 1245 07/24/20 1317  BP: 122/83 124/84  Pulse: 62 62  Resp: 15 16  Temp:  36.7 C  SpO2: 99% 100%    Last Pain:  Vitals:   07/24/20 1317  TempSrc:   PainSc: 0-No pain                 Deisha Stull S

## 2020-07-25 LAB — SURGICAL PATHOLOGY

## 2020-07-26 ENCOUNTER — Encounter (HOSPITAL_BASED_OUTPATIENT_CLINIC_OR_DEPARTMENT_OTHER): Payer: Self-pay | Admitting: Obstetrics & Gynecology

## 2021-01-05 IMAGING — US US PELVIS COMPLETE TRANSABD/TRANSVAG W DUPLEX
1 series · 13 of 25 positions shown · non-contrast
Comparison: 01/21/2010

CLINICAL DATA: Dysfunctional uterine bleeding

EXAM:
TRANSABDOMINAL AND TRANSVAGINAL ULTRASOUND OF PELVIS
DOPPLER ULTRASOUND OF OVARIES
TECHNIQUE: Both transabdominal and transvaginal ultrasound examinations of the
pelvis were performed. Transabdominal technique was performed for
global imaging of the pelvis including uterus, ovaries, adnexal
regions, and pelvic cul-de-sac.
It was necessary to proceed with endovaginal exam following the
transabdominal exam to visualize the uterus, endometrium and
ovaries. Color and duplex Doppler ultrasound was utilized to
evaluate blood flow to the ovaries.

[Series 1: us pelvis complete transabd/transvag w duplex · 13 of 88 slices shown]
[im 1/88]
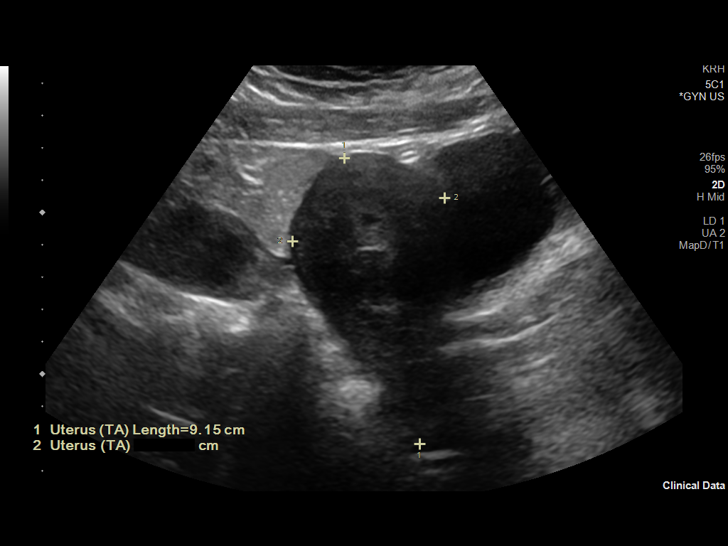
[im 8/88]
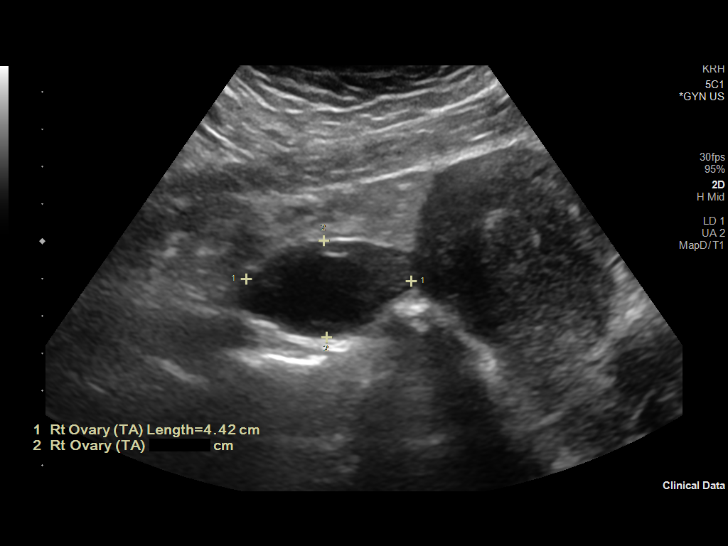
[im 15/88]
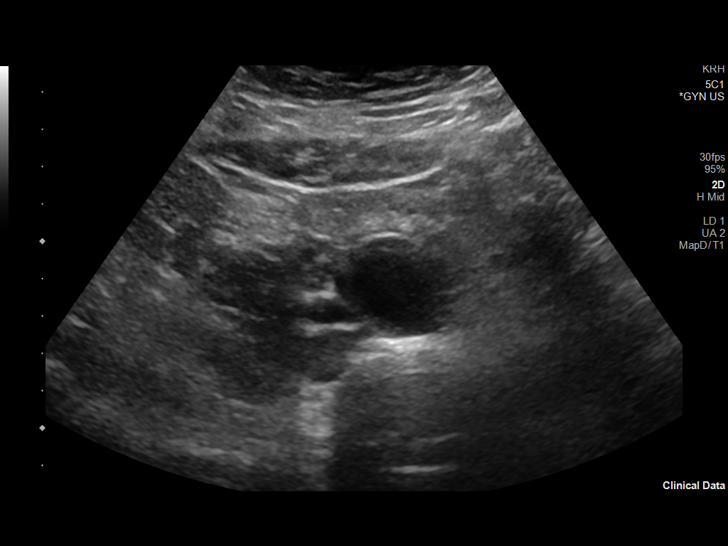
[im 22/88]
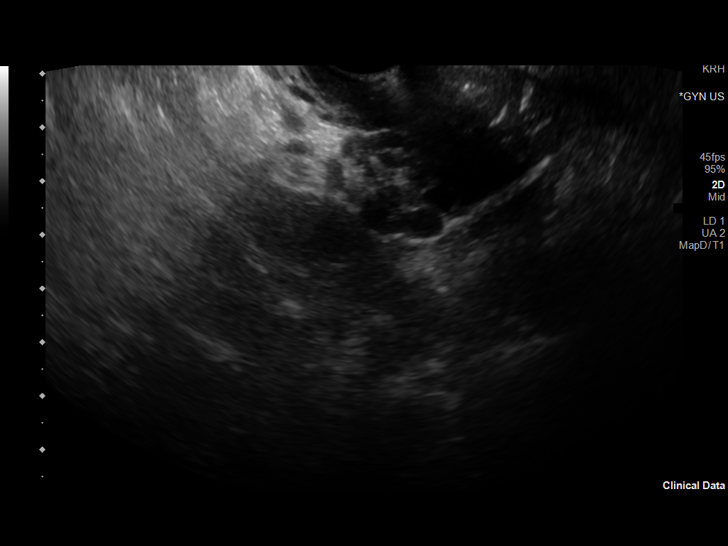
[im 30/88]
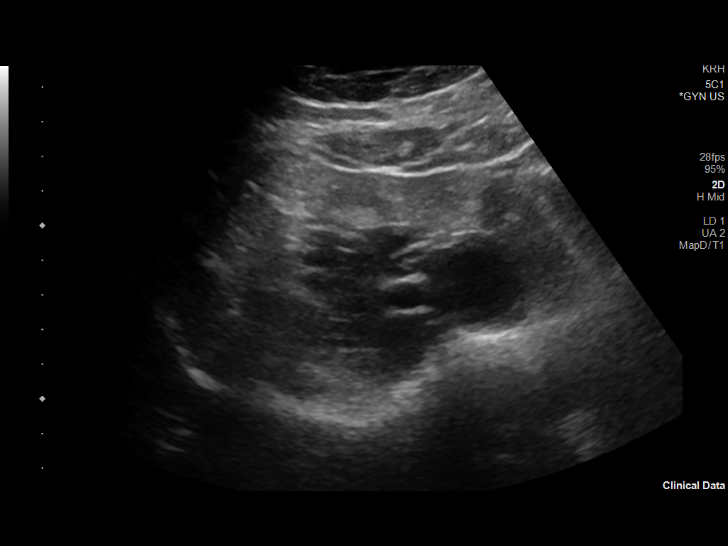
[im 37/88]
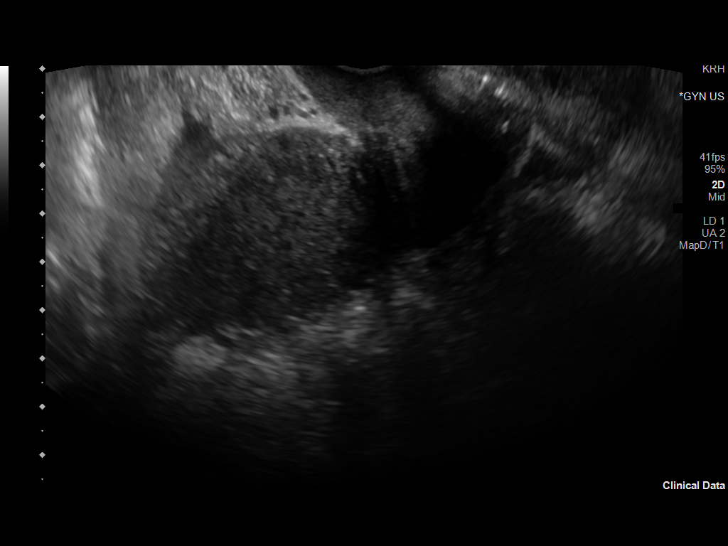
[im 44/88]
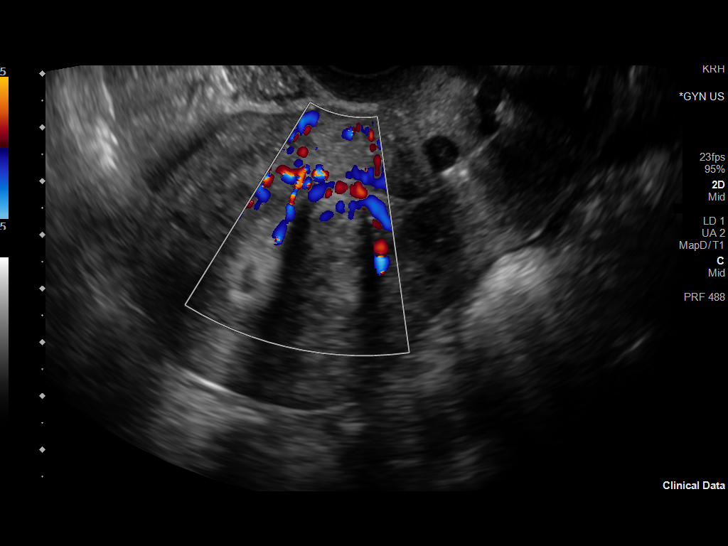
[im 51/88]
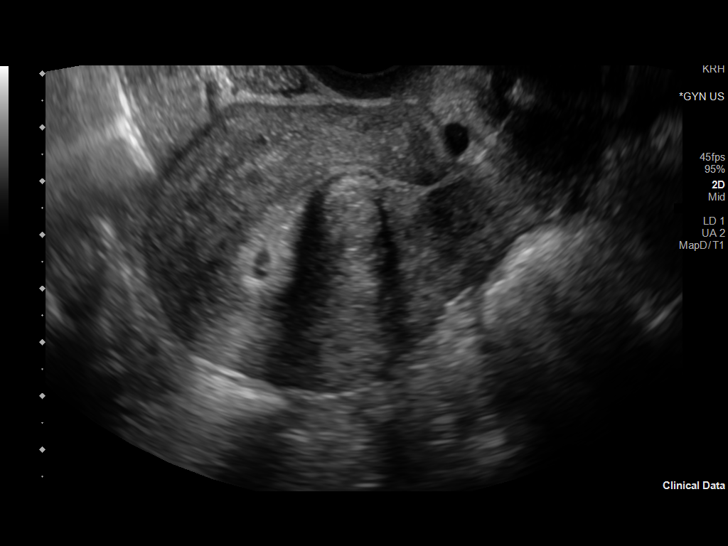
[im 59/88]
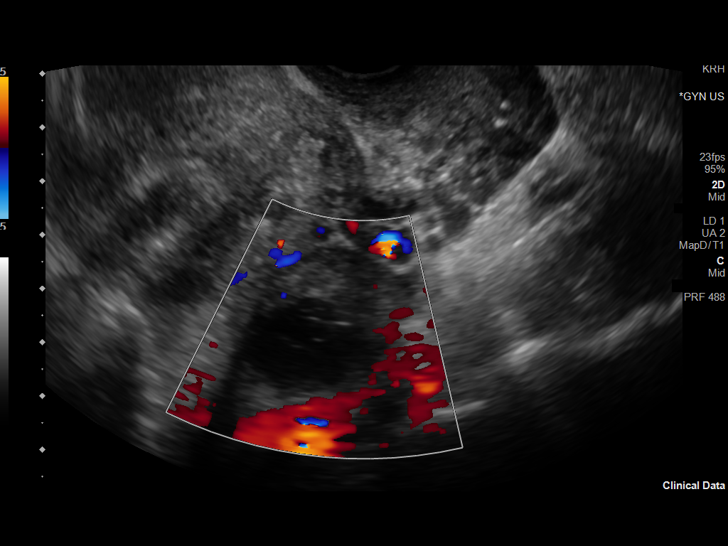
[im 66/88]
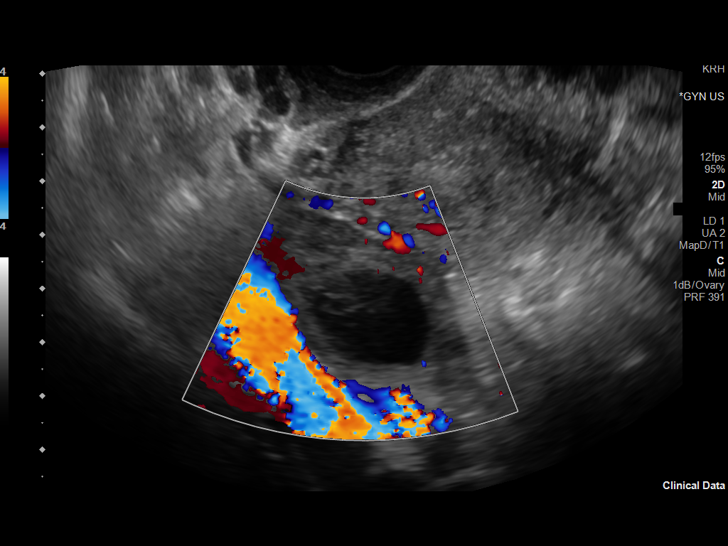
[im 73/88]
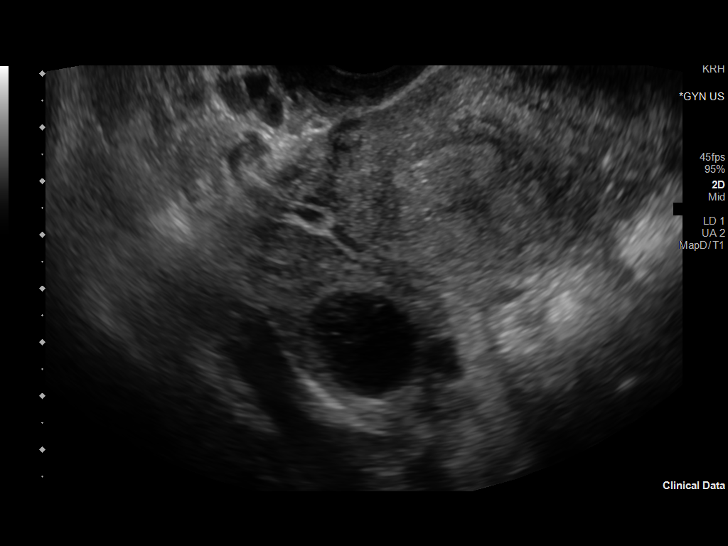
[im 80/88]
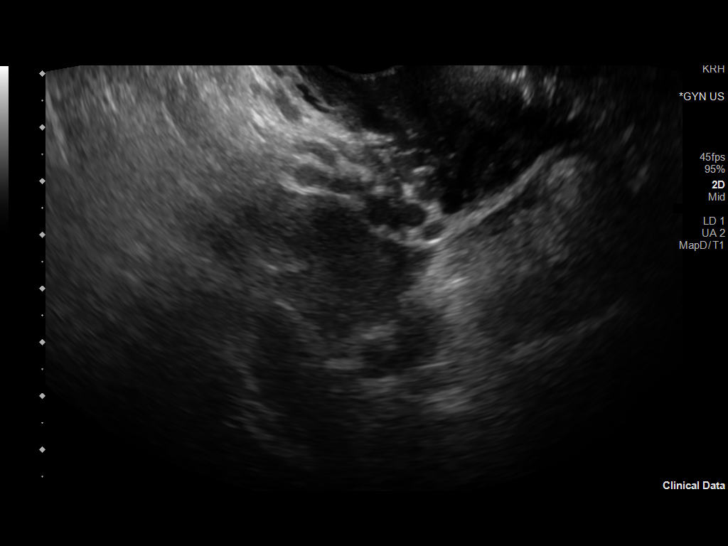
[im 88/88]
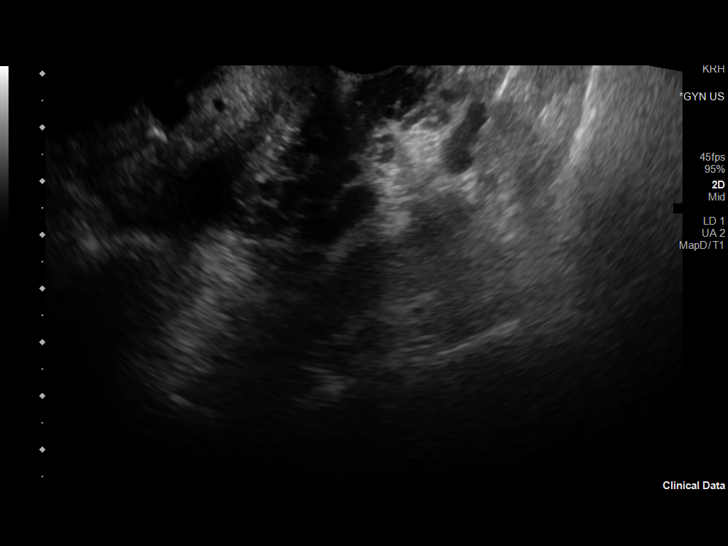

[13 of 25 positions shown; findings below may reference images not displayed]

FINDINGS: Uterus

Measurements: 9.3 x 4.9 x 6.1 cm = volume: 147 mL. There is a
submucosal fibroid along the posterior endometrial lining which
measures 1.9 x 1.5 x 1.4 cm. It exerts mass-effect on the
endometrium.

Endometrium

Thickness: 11 mm. There is a small amount of blood products within
endometrium.

Right ovary

Measurements: 3.9 x 2.4 x 3.1 cm = volume: 16 mL. There is an
ovarian mass without blood flow which measures 2.2 x 1.9 x 2.0 cm.
It demonstrates internal [place] echogenicity and is most consistent
with a hemorrhagic cyst.

Left ovary

Measurements: 2.8 x 2.1 x 2.5 cm = volume: 7.8 mL. Normal
appearance/no adnexal mass.

Pulsed Doppler evaluation of both ovaries demonstrates normal
low-resistance arterial and venous waveforms.

Other findings

Trace free fluid.
IMPRESSION: 1. There is a submucosal 1.9 cm fibroid which exerts mass effect on
the endometrium.

## 2021-01-13 ENCOUNTER — Other Ambulatory Visit: Payer: Self-pay

## 2021-01-13 ENCOUNTER — Ambulatory Visit (INDEPENDENT_AMBULATORY_CARE_PROVIDER_SITE_OTHER): Payer: Medicaid Other | Admitting: Obstetrics

## 2021-01-13 ENCOUNTER — Other Ambulatory Visit (HOSPITAL_COMMUNITY)
Admission: RE | Admit: 2021-01-13 | Discharge: 2021-01-13 | Disposition: A | Discharge status: Home / Self Care | Payer: Medicaid Other | Source: Ambulatory Visit | Attending: Obstetrics | Admitting: Obstetrics

## 2021-01-13 ENCOUNTER — Encounter: Payer: Self-pay | Admitting: Obstetrics

## 2021-01-13 VITALS — BP 133/87 | HR 85 | Ht 68.0 in | Wt 237.2 lb

## 2021-01-13 DIAGNOSIS — N898 Other specified noninflammatory disorders of vagina: Secondary | ICD-10-CM

## 2021-01-13 DIAGNOSIS — Z113 Encounter for screening for infections with a predominantly sexual mode of transmission: Secondary | ICD-10-CM

## 2021-01-13 DIAGNOSIS — R102 Pelvic and perineal pain: Secondary | ICD-10-CM | POA: Diagnosis not present

## 2021-01-13 MED ORDER — FLUCONAZOLE 150 MG PO TABS: 150.0000 mg | ORAL_TABLET | Freq: Once | ORAL | 0 refills | Status: AC

## 2021-01-13 MED ORDER — METRONIDAZOLE 500 MG PO TABS: 500.0000 mg | ORAL_TABLET | Freq: Two times a day (BID) | ORAL | 2 refills | Status: DC

## 2021-01-13 MED ORDER — DOXYCYCLINE HYCLATE 100 MG PO CAPS: 100.0000 mg | ORAL_CAPSULE | Freq: Two times a day (BID) | ORAL | 0 refills | Status: AC

## 2021-01-13 MED ORDER — IBUPROFEN 800 MG PO TABS: 800.0000 mg | ORAL_TABLET | Freq: Three times a day (TID) | ORAL | 5 refills | Status: DC | PRN

## 2021-01-13 NOTE — Progress Notes (Signed)
Patient ID: Shirley Ponce, female   DOB: 08-12-1975, 45 y.o.   MRN: [id number]  Chief Complaint  Patient presents with   Abdominal Pain    HPI Shirley Ponce is a 45 y.o. female.  Complains of pelvic pain and vaginal discharge x 1 month. HPI  Past Medical History:  Diagnosis Date   Anemia    Anxiety    Depression    Family history of breast cancer    Family history of colon cancer    Headache(784.0)    Migraine headache with aura    Obesity (BMI 35.0-39.9 without comorbidity)     Past Surgical History:  Procedure Laterality Date   CESAREAN SECTION  2010   CHOLECYSTECTOMY     DILATATION & CURETTAGE/HYSTEROSCOPY WITH MYOSURE N/A 07/24/2020   Procedure: DILATATION & CURETTAGE/HYSTEROSCOPY WITH MYOSURE AND VAGINAL MYOMECTOMY;  Surgeon: Woodroe Mode, MD;  Location: [facility];  Service: Gynecology;  Laterality: N/A;   DILATION AND CURETTAGE OF UTERUS     WISDOM TOOTH EXTRACTION      Family History  Problem Relation Age of Onset   Breast cancer Maternal Grandmother 74   Breast cancer Paternal Grandmother        age dx unk   Cancer Father 87       lung cancer, hx smoking   Thyroid disease Sister    Hypertension Sister    Breast cancer Mother 22       left breast, dx right breast cancer at 78   Colon cancer Mother 51   Breast cancer Other        5 aternal great aunts with breast cancer in 37's    Social History Social History   Tobacco Use   Smoking status: Never   Smokeless tobacco: Never  Vaping Use   Vaping Use: Never used  Substance Use Topics   Alcohol use: No   Drug use: No    Allergies  Allergen Reactions   Scopolamine Itching   Latex Hives   Mushroom Extract Complex Swelling    Current Outpatient Medications  Medication Sig Dispense Refill   hydrochlorothiazide (HYDRODIURIL) 25 MG tablet Take by mouth.     ibuprofen (ADVIL) 800 MG tablet Take 1 tablet (800 mg total) by mouth 3 (three) times daily. 21 tablet 0   Multiple Vitamin  (MULTIVITAMIN) tablet Take 1 tablet by mouth daily.     SUMAtriptan (IMITREX) 100 MG tablet Take 100 mg by mouth every 2 (two) hours as needed for migraine or headache. May repeat in 2 hours if headache persists or recurs.     thiamine (VITAMIN B-1) 100 MG tablet Take 100 mg by mouth daily.     No current facility-administered medications for this visit.    Review of Systems Review of Systems Constitutional: negative for fatigue and weight loss Respiratory: negative for cough and wheezing Cardiovascular: negative for chest pain, fatigue and palpitations Gastrointestinal: negative for abdominal pain and change in bowel habits Genitourinary: positive for pelvic pain and vaginal discharge Integument/breast: negative for nipple discharge Musculoskeletal:negative for myalgias Neurological: negative for gait problems and tremors Behavioral/Psych: negative for abusive relationship, depression Endocrine: negative for temperature intolerance      Blood pressure 133/87, pulse 85, height '5\' 8"'$  (1.727 m), weight 237 lb 3.2 oz (107.6 kg), last menstrual period 12/16/2020.  Physical Exam Physical Exam General:   Alert and no distress  Skin:   no rash or abnormalities  Lungs:   clear to auscultation bilaterally  Heart:   regular rate and rhythm, S1, S2 normal, no murmur, click, rub or gallop  Breasts:   Not examined  Abdomen:  normal findings: no organomegaly, soft, non-tender and no hernia  Pelvis:  External genitalia: normal general appearance Urinary system: urethral meatus normal and bladder without fullness, nontender Vaginal: normal without tenderness, induration or masses Cervix: normal appearance Adnexa: normal bimanual exam Uterus: anteverted and non-tender, normal size    I have spent a total of 20 minutes of face-to-face time, excluding clinical staff time, reviewing notes and preparing to see patient, ordering tests and/or medications, and counseling the patient.   Data  Reviewed Wet Prep  Assessment     1. Pelvic pain, probable PID Rx: - POCT Urinalysis Dipstick - US PELVIC COMPLETE WITH TRANSVAGINAL; Future - doxycycline (VIBRAMYCIN) 100 MG capsule; Take 1 capsule (100 mg total) by mouth 2 (two) times daily.  Dispense: 28 capsule; Refill: 0 - metroNIDAZOLE (FLAGYL) 500 MG tablet; Take 1 tablet (500 mg total) by mouth 2 (two) times daily.  Dispense: 28 tablet; Refill: 2 - ibuprofen (ADVIL) 800 MG tablet; Take 1 tablet (800 mg total) by mouth every 8 (eight) hours as needed.  Dispense: 30 tablet; Refill: 5  2. Vaginal discharge Rx: - Cervicovaginal ancillary only( [facility]) - fluconazole (DIFLUCAN) 150 MG tablet; Take 1 tablet (150 mg total) by mouth once for 1 dose.  Dispense: 1 tablet; Refill: 0  3. Screening for STD (sexually transmitted disease) Rx: - Hepatitis B surface antigen - Hepatitis C antibody - HIV Antibody (routine testing w rflx) - RPR     Plan   Follow up in 2 weeks   Shelly Bombard, MD 01/13/2021 3:53 PM

## 2021-01-13 NOTE — Progress Notes (Signed)
Patient presents for having abdominal pain. Sx started about a month and half weeks ago. She also complains of having yellowish vaginal discharge with odor and itching. Patient would like STD testing along with blood work.

## 2021-01-14 LAB — HIV ANTIBODY (ROUTINE TESTING W REFLEX): HIV Screen 4th Generation wRfx: NONREACTIVE

## 2021-01-14 LAB — HEPATITIS C ANTIBODY: Hep C Virus Ab: <0.1 s/co ratio (ref 0.0–0.9)

## 2021-01-14 LAB — HEPATITIS B SURFACE ANTIGEN: Hepatitis B Surface Ag: NEGATIVE

## 2021-01-14 LAB — RPR: RPR Ser Ql: NONREACTIVE

## 2021-01-15 LAB — POCT URINALYSIS DIPSTICK
Bilirubin, UA: NEGATIVE
Blood, UA: NEGATIVE
Glucose, UA: NEGATIVE
Ketones, UA: NEGATIVE
Leukocytes, UA: NEGATIVE
Nitrite, UA: NEGATIVE
Protein, UA: NEGATIVE
Spec Grav, UA: 1.015 (ref 1.010–1.025)
Urobilinogen, UA: 0.2 E.U./dL
pH, UA: 6 (ref 5.0–8.0)

## 2021-01-15 LAB — CERVICOVAGINAL ANCILLARY ONLY
Bacterial Vaginitis (gardnerella): POSITIVE — AB
Candida Glabrata: NEGATIVE
Candida Vaginitis: POSITIVE — AB
Chlamydia: NEGATIVE
Comment: NEGATIVE
Comment: NEGATIVE
Comment: NEGATIVE
Comment: NEGATIVE
Comment: NEGATIVE
Comment: NORMAL
Neisseria Gonorrhea: NEGATIVE
Trichomonas: NEGATIVE

## 2021-01-16 ENCOUNTER — Other Ambulatory Visit: Payer: Self-pay | Admitting: Obstetrics

## 2021-01-16 ENCOUNTER — Other Ambulatory Visit: Payer: Self-pay

## 2021-01-16 ENCOUNTER — Ambulatory Visit
Admission: RE | Admit: 2021-01-16 | Discharge: 2021-01-16 | Disposition: A | Discharge status: Home / Self Care | Payer: Medicaid Other | Source: Ambulatory Visit | Attending: Obstetrics | Admitting: Obstetrics

## 2021-01-16 DIAGNOSIS — R102 Pelvic and perineal pain: Secondary | ICD-10-CM | POA: Insufficient documentation

## 2021-01-16 DIAGNOSIS — B373 Candidiasis of vulva and vagina: Secondary | ICD-10-CM

## 2021-01-16 DIAGNOSIS — B3731 Acute candidiasis of vulva and vagina: Secondary | ICD-10-CM

## 2021-01-16 MED ORDER — FLUCONAZOLE 150 MG PO TABS: 150.0000 mg | ORAL_TABLET | Freq: Once | ORAL | 0 refills | Status: AC

## 2021-08-01 ENCOUNTER — Emergency Department (HOSPITAL_BASED_OUTPATIENT_CLINIC_OR_DEPARTMENT_OTHER): Payer: Medicaid Other

## 2021-08-01 ENCOUNTER — Encounter (HOSPITAL_BASED_OUTPATIENT_CLINIC_OR_DEPARTMENT_OTHER): Payer: Self-pay | Admitting: Emergency Medicine

## 2021-08-01 ENCOUNTER — Emergency Department (HOSPITAL_BASED_OUTPATIENT_CLINIC_OR_DEPARTMENT_OTHER)
Admission: EM | Admit: 2021-08-01 | Discharge: 2021-08-01 | Disposition: A | Discharge status: Home / Self Care | Payer: Medicaid Other | Attending: Emergency Medicine | Admitting: Emergency Medicine

## 2021-08-01 ENCOUNTER — Other Ambulatory Visit: Payer: Self-pay

## 2021-08-01 DIAGNOSIS — D72819 Decreased white blood cell count, unspecified: Secondary | ICD-10-CM | POA: Diagnosis not present

## 2021-08-01 DIAGNOSIS — B9689 Other specified bacterial agents as the cause of diseases classified elsewhere: Secondary | ICD-10-CM | POA: Insufficient documentation

## 2021-08-01 DIAGNOSIS — R5383 Other fatigue: Secondary | ICD-10-CM | POA: Diagnosis not present

## 2021-08-01 DIAGNOSIS — N760 Acute vaginitis: Secondary | ICD-10-CM | POA: Diagnosis not present

## 2021-08-01 DIAGNOSIS — D72810 Lymphocytopenia: Secondary | ICD-10-CM | POA: Insufficient documentation

## 2021-08-01 DIAGNOSIS — R102 Pelvic and perineal pain: Secondary | ICD-10-CM

## 2021-08-01 DIAGNOSIS — Z91040 Latex allergy status: Secondary | ICD-10-CM | POA: Insufficient documentation

## 2021-08-01 DIAGNOSIS — R1030 Lower abdominal pain, unspecified: Secondary | ICD-10-CM | POA: Diagnosis present

## 2021-08-01 LAB — CBC WITH DIFFERENTIAL/PLATELET
Abs Immature Granulocytes: 0.01 10*3/uL (ref 0.00–0.07)
Basophils Absolute: 0 10*3/uL (ref 0.0–0.1)
Basophils Relative: 0 %
Eosinophils Absolute: 0 10*3/uL (ref 0.0–0.5)
Eosinophils Relative: 1 %
HCT: 35.9 % — ABNORMAL LOW (ref 36.0–46.0)
Hemoglobin: 11.7 g/dL — ABNORMAL LOW (ref 12.0–15.0)
Immature Granulocytes: 0 %
Lymphocytes Relative: 17 %
Lymphs Abs: 0.6 10*3/uL — ABNORMAL LOW (ref 0.7–4.0)
MCH: 28.8 pg (ref 26.0–34.0)
MCHC: 32.6 g/dL (ref 30.0–36.0)
MCV: 88.4 fL (ref 80.0–100.0)
Monocytes Absolute: 0.6 10*3/uL (ref 0.1–1.0)
Monocytes Relative: 17 %
Neutro Abs: 2.2 10*3/uL (ref 1.7–7.7)
Neutrophils Relative %: 65 %
Platelets: 212 10*3/uL (ref 150–400)
RBC: 4.06 MIL/uL (ref 3.87–5.11)
RDW: 13.5 % (ref 11.5–15.5)
WBC: 3.4 10*3/uL — ABNORMAL LOW (ref 4.0–10.5)
nRBC: 0 % (ref 0.0–0.2)

## 2021-08-01 LAB — WET PREP, GENITAL
Sperm: NONE SEEN
Trich, Wet Prep: NONE SEEN
WBC, Wet Prep HPF POC: <10 (ref <10)
Yeast Wet Prep HPF POC: NONE SEEN

## 2021-08-01 LAB — COMPREHENSIVE METABOLIC PANEL
ALT: 13 U/L (ref 0–44)
AST: 19 U/L (ref 15–41)
Albumin: 3.7 g/dL (ref 3.5–5.0)
Alkaline Phosphatase: 48 U/L (ref 38–126)
Anion gap: 8 (ref 5–15)
BUN: 8 mg/dL (ref 6–20)
CO2: 29 mmol/L (ref 22–32)
Calcium: 9 mg/dL (ref 8.9–10.3)
Chloride: 98 mmol/L (ref 98–111)
Creatinine, Ser: 0.84 mg/dL (ref 0.44–1.00)
GFR, Estimated: >60 mL/min (ref >60)
Glucose, Bld: 104 mg/dL — ABNORMAL HIGH (ref 70–99)
Potassium: 2.4 mmol/L — CL (ref 3.5–5.1)
Sodium: 135 mmol/L (ref 135–145)
Total Bilirubin: 0.9 mg/dL (ref 0.3–1.2)
Total Protein: 7.1 g/dL (ref 6.5–8.1)

## 2021-08-01 LAB — URINALYSIS, ROUTINE W REFLEX MICROSCOPIC
Bilirubin Urine: NEGATIVE
Glucose, UA: NEGATIVE mg/dL
Hgb urine dipstick: NEGATIVE
Ketones, ur: NEGATIVE mg/dL
Leukocytes,Ua: NEGATIVE
Nitrite: NEGATIVE
Protein, ur: NEGATIVE mg/dL
Specific Gravity, Urine: 1.02 (ref 1.005–1.030)
pH: 7 (ref 5.0–8.0)

## 2021-08-01 LAB — PREGNANCY, URINE: Preg Test, Ur: NEGATIVE

## 2021-08-01 MED ORDER — ACETAMINOPHEN 325 MG PO TABS
650.0000 mg | ORAL_TABLET | Freq: Once | ORAL | Status: AC
Start: 2021-08-01 — End: 2021-08-01
  Administered 2021-08-01: 650 mg via ORAL
  Filled 2021-08-01: qty 2

## 2021-08-01 MED ORDER — POTASSIUM CHLORIDE CRYS ER 20 MEQ PO TBCR
40.0000 meq | EXTENDED_RELEASE_TABLET | Freq: Once | ORAL | Status: AC
  Administered 2021-08-01: 40 meq via ORAL
  Filled 2021-08-01: qty 2

## 2021-08-01 MED ORDER — IOHEXOL 300 MG/ML  SOLN
100.0000 mL | Freq: Once | INTRAMUSCULAR | Status: AC | PRN
  Administered 2021-08-01: 100 mL via INTRAVENOUS

## 2021-08-01 MED ORDER — METRONIDAZOLE 500 MG PO TABS
500.0000 mg | ORAL_TABLET | Freq: Two times a day (BID) | ORAL | 0 refills | Status: AC
Start: 2021-08-01 — End: 2021-08-08

## 2021-08-01 MED ORDER — ONDANSETRON HCL 4 MG/2ML IJ SOLN
4.0000 mg | Freq: Once | INTRAMUSCULAR | Status: AC
  Administered 2021-08-01: 4 mg via INTRAVENOUS
  Filled 2021-08-01: qty 2

## 2021-08-01 MED ORDER — POTASSIUM CHLORIDE CRYS ER 20 MEQ PO TBCR: 40.0000 meq | EXTENDED_RELEASE_TABLET | Freq: Every day | ORAL | 0 refills | Status: AC

## 2021-08-01 NOTE — ED Triage Notes (Signed)
C/o low abd pain for a couple of days with nausea. States she feels weak and lethargic.

## 2021-08-01 NOTE — ED Provider Notes (Signed)
[facility] Provider Note   CSN: [id number] Arrival date & time: 08/01/21  1055     History  Chief Complaint  Patient presents with   Abdominal Pain    Shirley Ponce is a 46 y.o. female. With past medical history of migraine who presents to the emergency department with abdominal pain.   Patient states that onset of symptoms began on Saturday.  She states that she has had lower abdominal discomfort with episodes of sharp shooting pain in her lower abdomen/pelvis.  She states that symptoms have progressively worsened and woke up this morning feeling woozy, nauseated and very fatigued.  She states throughout the week she has had episodes of nausea without vomiting.  She denies fevers but has had chills intermittently.  She does endorse that about 3 days ago she started having green to gray vaginal discharge which is new for her.  She denies odors.  She also endorses increased urinary frequency within the past week without dysuria.  Endorses decreased p.o. intake. She denies chest pain or shortness of breath, diarrhea, previous abdominal surgeries, back or flank pain.   Abdominal Pain Associated symptoms: chills, nausea and vaginal discharge   Associated symptoms: no chest pain, no diarrhea, no dysuria, no fever, no hematuria, no shortness of breath and no vomiting       Home Medications Prior to Admission medications   Medication Sig Start Date End Date Taking? Authorizing Provider  doxycycline (VIBRAMYCIN) 100 MG capsule Take 1 capsule (100 mg total) by mouth 2 (two) times daily. 01/13/21   Shelly Bombard, MD  hydrochlorothiazide (HYDRODIURIL) 25 MG tablet Take by mouth. 04/10/19   [provider]  ibuprofen (ADVIL) 800 MG tablet Take 1 tablet (800 mg total) by mouth every 8 (eight) hours as needed. 01/13/21   Shelly Bombard, MD  metroNIDAZOLE (FLAGYL) 500 MG tablet Take 1 tablet (500 mg total) by mouth 2 (two) times daily. 01/13/21   Shelly Bombard, MD  Multiple Vitamin (MULTIVITAMIN) tablet Take 1 tablet by mouth daily.    [provider]  SUMAtriptan (IMITREX) 100 MG tablet Take 100 mg by mouth every 2 (two) hours as needed for migraine or headache. May repeat in 2 hours if headache persists or recurs.    [provider]  thiamine (VITAMIN B-1) 100 MG tablet Take 100 mg by mouth daily.    [provider]      Allergies    Scopolamine, Latex, and Mushroom extract complex    Review of Systems   Review of Systems  Constitutional:  Positive for appetite change and chills. Negative for fever.  Respiratory:  Negative for shortness of breath.   Cardiovascular:  Negative for chest pain.  Gastrointestinal:  Positive for abdominal pain and nausea. Negative for blood in stool, diarrhea and vomiting.  Genitourinary:  Positive for frequency and vaginal discharge. Negative for decreased urine volume, dysuria, flank pain and hematuria.  Neurological:  Positive for light-headedness. Negative for syncope.  All other systems reviewed and are negative.  Physical Exam Updated Vital Signs Ht 5\' 8"  (1.727 m)    Wt 97.1 kg    BMI 32.54 kg/m  Physical Exam Vitals and nursing note reviewed. Exam conducted with a chaperone present.  Constitutional:      General: She is not in acute distress.    Appearance: Normal appearance. She is well-developed. She is ill-appearing. She is not toxic-appearing.  HENT:     Head: Normocephalic and atraumatic.  Nose: Nose normal.  Eyes:     General: No scleral icterus.    Pupils: Pupils are equal, round, and reactive to light.  Cardiovascular:     Rate and Rhythm: Normal rate and regular rhythm.     Pulses: Normal pulses.     Heart sounds: Normal heart sounds. No murmur heard. Pulmonary:     Effort: Pulmonary effort is normal. No respiratory distress.     Breath sounds: Normal breath sounds.  Abdominal:     General: Abdomen is protuberant. Bowel sounds are decreased.  There is no distension.     Palpations: Abdomen is soft.     Tenderness: There is abdominal tenderness in the right lower quadrant, suprapubic area and left lower quadrant. There is no right CVA tenderness, left CVA tenderness, guarding or rebound.     Hernia: No hernia is present.  Genitourinary:    General: Normal vulva.     Labia:        Right: No rash or lesion.        Left: No rash or lesion.      Vagina: Normal. No erythema, bleeding or lesions.     Cervix: Cervical motion tenderness and discharge present.     Adnexa:        Right: No tenderness.         Left: Tenderness present.   Musculoskeletal:        General: Normal range of motion.     Cervical back: Neck supple.     Right lower leg: No edema.     Left lower leg: No edema.  Skin:    General: Skin is warm and dry.     Capillary Refill: Capillary refill takes less than 2 seconds.  Neurological:     General: No focal deficit present.     Mental Status: She is alert and oriented to person, place, and time. Mental status is at baseline.  Psychiatric:        Mood and Affect: Mood normal.        Behavior: Behavior normal.        Thought Content: Thought content normal.        Judgment: Judgment normal.    ED Results / Procedures / Treatments   Labs (all labs ordered are listed, but only abnormal results are displayed) Labs Reviewed  WET PREP, GENITAL - Abnormal; Notable for the following components:      Result Value   Clue Cells Wet Prep HPF POC PRESENT (*)    All other components within normal limits  COMPREHENSIVE METABOLIC PANEL - Abnormal; Notable for the following components:   Potassium 2.4 (*)    Glucose, Bld 104 (*)    All other components within normal limits  CBC WITH DIFFERENTIAL/PLATELET - Abnormal; Notable for the following components:   WBC 3.4 (*)    Hemoglobin 11.7 (*)    HCT 35.9 (*)    Lymphs Abs 0.6 (*)    All other components within normal limits  RESP PANEL BY RT-PCR (FLU A&B, COVID) [id number]   URINALYSIS, ROUTINE W REFLEX MICROSCOPIC  PREGNANCY, URINE  GC/CHLAMYDIA PROBE AMP ([facility]) NOT AT [facility]   EKG None  Radiology CT Abdomen Pelvis W Contrast  Result Date: 08/01/2021 CLINICAL DATA:  Acute nonlocalized abdominal pain. EXAM: CT ABDOMEN AND PELVIS WITH CONTRAST TECHNIQUE: Multidetector CT imaging of the abdomen and pelvis was performed using the standard protocol following bolus administration of intravenous contrast. RADIATION DOSE REDUCTION: This exam was performed  according to the departmental dose-optimization program which includes automated exposure control, adjustment of the mA and/or kV according to patient size and/or use of iterative reconstruction technique. CONTRAST:  117mL OMNIPAQUE IOHEXOL 300 MG/ML  SOLN COMPARISON:  Same day pelvic ultrasound. FINDINGS: Lower chest: No acute abnormality. Hepatobiliary: No suspicious hepatic lesion. Gallbladder surgically absent. No biliary ductal dilation. Pancreas: No pancreatic ductal dilation or evidence of acute inflammation. Spleen: No splenomegaly or focal splenic lesion. Adrenals/Urinary Tract: Bilateral adrenal glands appear normal. No hydronephrosis. Symmetric bilateral renal enhancement and excretion of contrast. No solid enhancing renal mass. Urinary bladder is unremarkable for degree of distension. Stomach/Bowel: No enteric contrast was administered. Stomach is unremarkable for degree of distension. No pathologic dilation of small or large bowel. Vascular/Lymphatic: Normal caliber abdominal aorta. No pathologically enlarged abdominal lymph nodes. Reproductive: Hyperenhancing 13 mm soft tissue nodularity in the uterus likely corresponds with the submucosal fibroid seen on same day pelvic ultrasound. 2 cm left ovarian cyst corresponds with the probable hemorrhagic cyst seen on same day ultrasound. Right adnexa is unremarkable. Other: Trace pelvic free fluid may be physiologic or reflect recent ovarian cyst rupture.  Musculoskeletal: No acute or significant osseous findings. IMPRESSION: 1. Trace pelvic free fluid may be physiologic or reflect recent ovarian cyst rupture. 2. Hemorrhagic 2.0 cm Left ovarian cyst evaluated on same day pelvic ultrasound. 3. Fibroid uterus. Electronically Signed   By: Dahlia Bailiff M.D.   On: 08/01/2021 14:26   US PELVIC COMPLETE W TRANSVAGINAL AND TORSION R/O  Result Date: 08/01/2021 CLINICAL DATA:  Left adnexal tenderness EXAM: TRANSABDOMINAL AND TRANSVAGINAL ULTRASOUND OF PELVIS DOPPLER ULTRASOUND OF OVARIES TECHNIQUE: Both transabdominal and transvaginal ultrasound examinations of the pelvis were performed. Transabdominal technique was performed for global imaging of the pelvis including uterus, ovaries, adnexal regions, and pelvic cul-de-sac. It was necessary to proceed with endovaginal exam following the transabdominal exam to visualize the uterus/endometrium and ovaries/adnexa. Color and duplex Doppler ultrasound was utilized to evaluate blood flow to the ovaries. COMPARISON:  Ultrasound 04/27/2021 FINDINGS: Uterus Measurements: 8.3 x 5.8 x 6.6 cm = volume: 168.6 mL. Nabothian cysts noted along the cervix. There is a submucosal fibroid measuring 1.9 x 1.9 x 1.4 cm. Endometrium Thickness: 11 mm.  No focal abnormality visualized. Right ovary Measurements: 5.6 x 2.0 x 3.0 cm = volume: 18.9 mL. Normal appearance/no adnexal mass. Left ovary Measurements: 3.3 x 2.5 x 2.5 cm = volume: 11.6 mL. Heterogeneous cystic area measuring 1.8 x 1.7 x 1.8 cm. There is no internal vascularity. This is likely hemorrhagic cyst. Pulsed Doppler evaluation demonstrates normal low-resistance arterial and venous waveforms in both ovaries. Other: Small volume free fluid in the pelvis. IMPRESSION: 1.8 cm heterogeneous cystic area in the left ovary most likely representing a hemorrhagic cyst. Small volume free fluid in the pelvis, possibly recently ruptured. Unremarkable right ovary.  No evidence of ovarian torsion.  1.9 cm submucosal fibroid. Electronically Signed   By: Maurine Simmering M.D.   On: 08/01/2021 13:33    Procedures Pelvic exam  Date/Time: 08/01/2021 12:17 PM Performed by: Mickie Hillier, PA-C Authorized by: Mickie Hillier, PA-C  Consent: Verbal consent obtained. Risks and benefits: risks, benefits and alternatives were discussed Consent given by: patient Patient understanding: patient states understanding of the procedure being performed Patient consent: the patient's understanding of the procedure matches consent given Procedure consent: procedure consent matches procedure scheduled Relevant documents: relevant documents present and verified Test results: test results available and properly labeled Site marked: the operative site was  marked Imaging studies: imaging studies available Patient identity confirmed: verbally with patient Preparation: Patient was prepped and draped in the usual sterile fashion. Local anesthesia used: no  Anesthesia: Local anesthesia used: no  Sedation: Patient sedated: no  Patient tolerance: patient tolerated the procedure well with no immediate complications Comments: Chaperone present, nurse Terri Piedra, RN     Medications Ordered in ED Medications  ondansetron [facility]) injection 4 mg (4 mg Intravenous Given 08/01/21 1115)  potassium chloride SA (KLOR-CON M) CR tablet 40 mEq (40 mEq Oral Given 08/01/21 1311)  acetaminophen (TYLENOL) tablet 650 mg (650 mg Oral Given 08/01/21 1311)  iohexol (OMNIPAQUE) 300 MG/ML solution 100 mL (100 mLs Intravenous Contrast Given 08/01/21 1350)    ED Course/ Medical Decision Making/ A&P                           Medical Decision Making Amount and/or Complexity of Data Reviewed Labs: ordered. Radiology: ordered. ECG/medicine tests: ordered.  Risk OTC drugs. Prescription drug management.  Patient presents to the ED with complaints of abdominal pain. This involves an extensive number of treatment options, and  is a complaint that carries with it a high risk of complications and morbidity.   Additional history obtained:  Additional history obtained from: none External records from outside source obtained and reviewed including: previous primary care records  Lab Results: I personally ordered, reviewed, and interpreted labs. Pertinent results include: CBC with leukopenia to 3.4 CMP with potassium of 2.4 UA negative Urine pregnancy negative Wet prep positive for clue cells GC/chlamydia pending  Imaging Studies ordered:  I ordered imaging studies which included ultrasound and CT.  I independently reviewed & interpreted imaging & am in agreement with radiology impression. Imaging shows: Ultrasound with evidence of hemorrhagic cyst on the left ovary, small volume free fluid in the pelvis CT abdomen pelvis with trace pelvic free fluid, hemorrhagic cyst, otherwise negative  Medications  I ordered medication including Zofran for nausea, Tylenol for pain, potassium for replacement  Reevaluation of the patient after medication shows that patient improved  ED Course: 46 year old female who presents to the emergency department with abdominal pain.   Abdominal exam without peritoneal signs.  There is no evidence of an acute abdomen.  She is ill-appearing but nontoxic in appearance.  She did have pelvic exam performed with CMT as well as left adnexal tenderness.  Due to this we obtained a ultrasound which showed evidence of hemorrhagic cyst on the left ovary which explains her left adnexal tenderness.  No evidence of TOA or torsion.  Findings on ultrasound do not completely explain patient's bilateral pelvic pain.  We did send wet prep, GC/chlamydia.  Obtain CT abdomen pelvis which has similar findings.  Her wet prep came back positive for BV.  Wet prep without white blood cells, so doubt that she has a sending pelvic inflammatory disease.  Additionally given her work-up I have low suspicion for acute  hepatobiliary disease, acute pancreatitis, PUD, gastric perforation, acute infectious process such as hepatitis, pyelonephritis, acute appendicitis, vascular catastrophe, bowel obstruction or viscus perforation, diverticulitis.  Her presentation not consistent with other acute, emergent cause abdominal pain at this time.  She was given Tylenol and Zofran with improvement in symptoms.  We did give her 1 dose of potassium for potassium of 2.4.  I will prescribe her a short course of repletion.  Additionally have swabbed her for COVID-19 given her lymphopenia and general malaise.  She can  follow-up outpatient with her MyChart for this.  She is instructed to follow-up with PCP in 1 week to have her potassium rechecked.  Given return precautions for worsening symptoms.  She verbalized understanding.  We will also prescribe her metronidazole for bacterial vaginosis.  After consideration of the diagnostic results and the patients response to treatment, I feel that the patent would benefit from discharge. The patient has been appropriately medically screened and/or stabilized in the ED. I have low suspicion for any other emergent medical condition which would require further screening, evaluation or treatment in the ED or require inpatient management. The patient is overall well appearing and non-toxic in appearance. They are hemodynamically stable at time of discharge.   Final Clinical Impression(s) / ED Diagnoses Final diagnoses:  Lower abdominal pain  Bacterial vaginosis    Rx / DC Orders ED Discharge Orders          Ordered    potassium chloride SA (KLOR-CON M) 20 MEQ tablet  Daily        08/01/21 1505    metroNIDAZOLE (FLAGYL) 500 MG tablet  2 times daily        08/01/21 1505              Mickie Hillier, PA-C 08/01/21 Kai Levins, MD 08/04/21 [phone]

## 2021-08-01 NOTE — Discharge Instructions (Addendum)
You were seen in the ED for abdominal pain. While you were here we did an ultrasound which shows you had a cyst rupture in your left ovary. This will resolve on its own but may be a component of your symptoms. Please follow-up in your MyChart for your COVID-19 results. This may be a component of your symptoms. You have bacterial vaginosis which is not an STI. Please take metronidazole twice daily for 7 days. Do not drink alcohol with this. Additionally, your potassium was low. You will take potassium for the next 4 days. Please follow-up with primary care to have your labs repeated in 1 week to ensure potassium is normal.

## 2021-08-01 NOTE — ED Notes (Signed)
Pt. Refused the COVID PCR test

## 2021-08-04 LAB — GC/CHLAMYDIA PROBE AMP (~~LOC~~) NOT AT ARMC
Chlamydia: NEGATIVE
Comment: NEGATIVE
Comment: NORMAL
Neisseria Gonorrhea: NEGATIVE

## 2021-12-21 ENCOUNTER — Emergency Department (HOSPITAL_BASED_OUTPATIENT_CLINIC_OR_DEPARTMENT_OTHER)
Admission: EM | Admit: 2021-12-21 | Discharge: 2021-12-21 | Disposition: A | Discharge status: Home / Self Care | Payer: Medicaid Other | Attending: Emergency Medicine | Admitting: Emergency Medicine

## 2021-12-21 ENCOUNTER — Other Ambulatory Visit: Payer: Self-pay

## 2021-12-21 ENCOUNTER — Encounter (HOSPITAL_BASED_OUTPATIENT_CLINIC_OR_DEPARTMENT_OTHER): Payer: Self-pay | Admitting: Emergency Medicine

## 2021-12-21 ENCOUNTER — Emergency Department (HOSPITAL_BASED_OUTPATIENT_CLINIC_OR_DEPARTMENT_OTHER): Payer: Medicaid Other

## 2021-12-21 DIAGNOSIS — Z91040 Latex allergy status: Secondary | ICD-10-CM | POA: Diagnosis not present

## 2021-12-21 DIAGNOSIS — E876 Hypokalemia: Secondary | ICD-10-CM | POA: Diagnosis not present

## 2021-12-21 DIAGNOSIS — B9689 Other specified bacterial agents as the cause of diseases classified elsewhere: Secondary | ICD-10-CM

## 2021-12-21 DIAGNOSIS — E871 Hypo-osmolality and hyponatremia: Secondary | ICD-10-CM | POA: Diagnosis not present

## 2021-12-21 DIAGNOSIS — N760 Acute vaginitis: Secondary | ICD-10-CM | POA: Diagnosis not present

## 2021-12-21 DIAGNOSIS — R1031 Right lower quadrant pain: Secondary | ICD-10-CM | POA: Diagnosis present

## 2021-12-21 DIAGNOSIS — R102 Pelvic and perineal pain: Secondary | ICD-10-CM

## 2021-12-21 LAB — PREGNANCY, URINE: Preg Test, Ur: NEGATIVE

## 2021-12-21 LAB — CBC WITH DIFFERENTIAL/PLATELET
Abs Immature Granulocytes: 0.01 10*3/uL (ref 0.00–0.07)
Basophils Absolute: 0 10*3/uL (ref 0.0–0.1)
Basophils Relative: 1 %
Eosinophils Absolute: 0.1 10*3/uL (ref 0.0–0.5)
Eosinophils Relative: 4 %
HCT: 33 % — ABNORMAL LOW (ref 36.0–46.0)
Hemoglobin: 10.6 g/dL — ABNORMAL LOW (ref 12.0–15.0)
Immature Granulocytes: 0 %
Lymphocytes Relative: 38 %
Lymphs Abs: 1.4 10*3/uL (ref 0.7–4.0)
MCH: 28 pg (ref 26.0–34.0)
MCHC: 32.1 g/dL (ref 30.0–36.0)
MCV: 87.3 fL (ref 80.0–100.0)
Monocytes Absolute: 0.4 10*3/uL (ref 0.1–1.0)
Monocytes Relative: 11 %
Neutro Abs: 1.7 10*3/uL (ref 1.7–7.7)
Neutrophils Relative %: 46 %
Platelets: 238 10*3/uL (ref 150–400)
RBC: 3.78 MIL/uL — ABNORMAL LOW (ref 3.87–5.11)
RDW: 15 % (ref 11.5–15.5)
WBC: 3.7 10*3/uL — ABNORMAL LOW (ref 4.0–10.5)
nRBC: 0 % (ref 0.0–0.2)

## 2021-12-21 LAB — WET PREP, GENITAL
Sperm: NONE SEEN
Trich, Wet Prep: NONE SEEN
WBC, Wet Prep HPF POC: <10 (ref <10)
Yeast Wet Prep HPF POC: NONE SEEN

## 2021-12-21 LAB — URINALYSIS, ROUTINE W REFLEX MICROSCOPIC
Bilirubin Urine: NEGATIVE
Glucose, UA: NEGATIVE mg/dL
Hgb urine dipstick: NEGATIVE
Ketones, ur: NEGATIVE mg/dL
Leukocytes,Ua: NEGATIVE
Nitrite: NEGATIVE
Protein, ur: NEGATIVE mg/dL
Specific Gravity, Urine: 1.02 (ref 1.005–1.030)
pH: 6.5 (ref 5.0–8.0)

## 2021-12-21 LAB — COMPREHENSIVE METABOLIC PANEL
ALT: 10 U/L (ref 0–44)
AST: 16 U/L (ref 15–41)
Albumin: 3.4 g/dL — ABNORMAL LOW (ref 3.5–5.0)
Alkaline Phosphatase: 45 U/L (ref 38–126)
Anion gap: 7 (ref 5–15)
BUN: 8 mg/dL (ref 6–20)
CO2: 28 mmol/L (ref 22–32)
Calcium: 9 mg/dL (ref 8.9–10.3)
Chloride: 101 mmol/L (ref 98–111)
Creatinine, Ser: 0.96 mg/dL (ref 0.44–1.00)
GFR, Estimated: >60 mL/min (ref >60)
Glucose, Bld: 90 mg/dL (ref 70–99)
Potassium: 2.8 mmol/L — ABNORMAL LOW (ref 3.5–5.1)
Sodium: 136 mmol/L (ref 135–145)
Total Bilirubin: 0.6 mg/dL (ref 0.3–1.2)
Total Protein: 6.9 g/dL (ref 6.5–8.1)

## 2021-12-21 MED ORDER — FENTANYL CITRATE PF 50 MCG/ML IJ SOSY
50.0000 ug | PREFILLED_SYRINGE | Freq: Once | INTRAMUSCULAR | Status: AC
  Administered 2021-12-21: 50 ug via INTRAVENOUS
  Filled 2021-12-21: qty 1

## 2021-12-21 MED ORDER — SODIUM CHLORIDE 0.9 % IV BOLUS
500.0000 mL | Freq: Once | INTRAVENOUS | Status: AC
  Administered 2021-12-21: 500 mL via INTRAVENOUS

## 2021-12-21 MED ORDER — METRONIDAZOLE 500 MG PO TABS: 500.0000 mg | ORAL_TABLET | Freq: Two times a day (BID) | ORAL | 0 refills | Status: AC

## 2021-12-21 MED ORDER — IBUPROFEN 800 MG PO TABS: 800.0000 mg | ORAL_TABLET | Freq: Three times a day (TID) | ORAL | 0 refills | Status: AC | PRN

## 2021-12-21 NOTE — ED Triage Notes (Signed)
Pt arrives pov, steady gait, c/o RLQ pain and vaginal d/c x 4 days.

## 2021-12-21 NOTE — ED Notes (Signed)
Pt ambulatory with steady gait to restroom, specimen collection device provided

## 2021-12-21 NOTE — Discharge Instructions (Signed)
You are seen in the emergency department for some lower abdominal pain and vaginal discharge.  You had blood work urinalysis and ultrasound.  You did test positive for bacterial vaginosis and we are starting you on some antibiotics.  Please do not use alcohol when taking these medications.  Follow-up with your regular doctor.  Return to the emergency department if any worsening or concerning symptoms.

## 2021-12-21 NOTE — ED Notes (Signed)
ED Provider at bedside. 

## 2021-12-21 NOTE — ED Provider Notes (Signed)
[facility] EMERGENCY DEPARTMENT Provider Note   CSN: [id number] Arrival date & time: 12/21/21  6644     History  Chief Complaint  Patient presents with   Abdominal Pain    Shirley Ponce is a 46 y.o. female.  She has a history of uterine fibroids dysfunctional uterine bleeding.  Complaining of right lower quadrant pain 7 out of 10 for the last 5 days.  Associated with some yellow-green vaginal discharge that is been intermittent.  No vaginal bleeding last menstrual period about 10 days ago.  She said she has had this pain before and she associates it with her fibroids.  No fevers chills nausea vomiting.  No diarrhea constipation or urinary symptoms.  She has tried nothing for her symptoms.  The history is provided by the patient.  Abdominal Pain Pain location:  RLQ Pain radiates to:  Does not radiate Pain severity:  Severe Onset quality:  Gradual Duration:  5 days Timing:  Constant Progression:  Unchanged Chronicity:  Recurrent Context: not sick contacts and not trauma   Relieved by:  None tried Worsened by:  Nothing Ineffective treatments:  None tried Associated symptoms: vaginal discharge   Associated symptoms: no anorexia, no chest pain, no constipation, no cough, no diarrhea, no dysuria, no fever, no nausea, no shortness of breath, no sore throat, no vaginal bleeding and no vomiting        Home Medications Prior to Admission medications   Medication Sig Start Date End Date Taking? Authorizing Provider  doxycycline (VIBRAMYCIN) 100 MG capsule Take 1 capsule (100 mg total) by mouth 2 (two) times daily. 01/13/21   Shelly Bombard, MD  hydrochlorothiazide (HYDRODIURIL) 25 MG tablet Take by mouth. 04/10/19   [provider]  ibuprofen (ADVIL) 800 MG tablet Take 1 tablet (800 mg total) by mouth every 8 (eight) hours as needed. 01/13/21   Shelly Bombard, MD  Multiple Vitamin (MULTIVITAMIN) tablet Take 1 tablet by mouth daily.    [provider]   potassium chloride SA (KLOR-CON M) 20 MEQ tablet Take 2 tablets (40 mEq total) by mouth daily for 4 days. 08/02/21 08/06/21  Mickie Hillier, PA-C  SUMAtriptan (IMITREX) 100 MG tablet Take 100 mg by mouth every 2 (two) hours as needed for migraine or headache. May repeat in 2 hours if headache persists or recurs.    [provider]  thiamine (VITAMIN B-1) 100 MG tablet Take 100 mg by mouth daily.    [provider]      Allergies    Scopolamine, Latex, and Mushroom extract complex    Review of Systems   Review of Systems  Constitutional:  Negative for fever.  HENT:  Negative for sore throat.   Respiratory:  Negative for cough and shortness of breath.   Cardiovascular:  Negative for chest pain.  Gastrointestinal:  Positive for abdominal pain. Negative for anorexia, constipation, diarrhea, nausea and vomiting.  Genitourinary:  Positive for vaginal discharge. Negative for dysuria and vaginal bleeding.  Musculoskeletal:  Negative for back pain.  Skin:  Negative for rash.    Physical Exam Updated Vital Signs BP 122/89 (BP Location: Right Arm)   Pulse 69   Temp 98.4 F (36.9 C) (Oral)   Resp 18   Ht 5' 8.5" (1.74 m)   Wt 96.2 kg   LMP 12/14/2021   SpO2 100%   BMI 31.77 kg/m  Physical Exam Vitals and nursing note reviewed.  Constitutional:      General: She is  not in acute distress.    Appearance: Normal appearance. She is well-developed.  HENT:     Head: Normocephalic and atraumatic.  Eyes:     Conjunctiva/sclera: Conjunctivae normal.  Cardiovascular:     Rate and Rhythm: Normal rate and regular rhythm.     Heart sounds: No murmur heard. Pulmonary:     Effort: Pulmonary effort is normal. No respiratory distress.     Breath sounds: Normal breath sounds.  Abdominal:     Palpations: Abdomen is soft.     Tenderness: There is abdominal tenderness in the right lower quadrant. There is no guarding or rebound.  Genitourinary:    Comments: Pelvic exam with  Benjamine Mola as chaperone.  Normal external genitalia.  Small amount of white discharge in vault sent for samples.  Bimanual exam with no cervical motion tenderness.  Does have some right adnexal tenderness although no masses. Musculoskeletal:        General: No swelling.     Cervical back: Neck supple.  Skin:    General: Skin is warm and dry.     Capillary Refill: Capillary refill takes less than 2 seconds.  Neurological:     General: No focal deficit present.     Mental Status: She is alert.     ED Results / Procedures / Treatments   Labs (all labs ordered are listed, but only abnormal results are displayed) Labs Reviewed  WET PREP, GENITAL - Abnormal; Notable for the following components:      Result Value   Clue Cells Wet Prep HPF POC PRESENT (*)    All other components within normal limits  COMPREHENSIVE METABOLIC PANEL - Abnormal; Notable for the following components:   Potassium 2.8 (*)    Albumin 3.4 (*)    All other components within normal limits  CBC WITH DIFFERENTIAL/PLATELET - Abnormal; Notable for the following components:   WBC 3.7 (*)    RBC 3.78 (*)    Hemoglobin 10.6 (*)    HCT 33.0 (*)    All other components within normal limits  URINALYSIS, ROUTINE W REFLEX MICROSCOPIC  PREGNANCY, URINE  RPR  GC/CHLAMYDIA PROBE AMP ([place]) NOT AT [facility]    EKG None  Radiology US PELVIC COMPLETE W TRANSVAGINAL AND TORSION R/O  Result Date: 12/21/2021 CLINICAL DATA:  Right-sided pelvic pain 5 days. Personal history of uterine fibroids. Status post previous fibroid resection. EXAM: TRANSABDOMINAL AND TRANSVAGINAL ULTRASOUND OF PELVIS DOPPLER ULTRASOUND OF OVARIES TECHNIQUE: Both transabdominal and transvaginal ultrasound examinations of the pelvis were performed. Transabdominal technique was performed for global imaging of the pelvis including uterus, ovaries, adnexal regions, and pelvic cul-de-sac. It was necessary to proceed with endovaginal exam following the  transabdominal exam to visualize the endometrium and adnexa. Color and duplex Doppler ultrasound was utilized to evaluate blood flow to the ovaries. COMPARISON:  Pelvic ultrasound 07/12/2021 FINDINGS: Uterus Measurements: 11.3 x 5.9 x 7.2 = volume: 257 mL. Submucosal fibroid is stable measuring 1.7 x 1.7 x 1.5 cm. Nabothian cysts again noted. Endometrium Thickness: 16 mm. Measurement is exaggerated due to the fibroid. No focal abnormality visualized. Right ovary Not visualized due to overlying bowel gas. Left ovary Measurements: 5.5 x 3.2 x 3.6 cm = volume: 34 mL. A simple cyst measures 3.2 x 2.4 x 3.2 cm. No other focal lesions are present. Pulsed Doppler evaluation of the left ovary demonstrates normal low-resistance arterial and venous waveforms. Other findings Small amount of free fluid is likely physiologic. IMPRESSION: 1. Right ovary not visualized. 2.  Stable appearance of submucosal fibroid. 3. Simple cyst in the left ovary. No follow-up imaging recommended. Note: This recommendation does not apply to premenarchal patients and to those with increased risk (genetic, family history, elevated tumor markers or other high-risk factors) of ovarian cancer. Reference: JACR 2020 Feb; 17(2):248-254 4. Small amount of free fluid is likely physiologic. Electronically Signed   By: San Morelle M.D.   On: 12/21/2021 10:53    Procedures Procedures    Medications Ordered in ED Medications  sodium chloride 0.9 % bolus 500 mL (has no administration in time range)  fentaNYL (SUBLIMAZE) injection 50 mcg (has no administration in time range)    ED Course/ Medical Decision Making/ A&P                           Medical Decision Making Amount and/or Complexity of Data Reviewed Labs: ordered. Radiology: ordered.  Risk Prescription drug management.  This patient complains of right lower quadrant pain and vaginal discharge; this involves an extensive number of treatment Options and is a complaint that  carries with it a high risk of complications and morbidity. The differential includes appendicitis, ovarian torsion, cyst, PID, pyelonephritis, urinary tract infection, vaginitis  I ordered, reviewed and interpreted labs, which included CBC with low white count stable from priors, hemoglobin slightly lower than priors, chemistries with low sodium normal renal function normal LFTs, urinalysis without signs of infection, wet prep positive for clue cells, gonorrhea chlamydia pending I ordered medication IV fluids and pain medication and reviewed PMP when indicated. I ordered imaging studies which included pelvic ultrasound and I independently    visualized and interpreted imaging which showed ovarian cyst no acute findings  Previous records obtained and reviewed in epic no recent admissions   Cardiac monitoring reviewed, normal sinus rhythm Social determinants considered, no significant barriers Critical Interventions: None  After the interventions stated above, I reevaluated the patient and found patient tolerating p.o. without any difficulty Admission and further testing considered, recommended increase banana intake and ibuprofen for pain.  Close follow-up with gynecology.  Will cover with metronidazole for her BV, counseled to not take alcohol.  Return instructions discussed          Final Clinical Impression(s) / ED Diagnoses Final diagnoses:  Pelvic pain in female  Bacterial vaginosis  Hypokalemia    Rx / DC Orders ED Discharge Orders          Ordered    metroNIDAZOLE (FLAGYL) 500 MG tablet  2 times daily        12/21/21 1033    ibuprofen (ADVIL) 800 MG tablet  Every 8 hours PRN        12/21/21 1100              Hayden Rasmussen, MD 12/21/21 1737

## 2021-12-21 NOTE — ED Notes (Addendum)
Pelvic Cart at bedside

## 2021-12-21 NOTE — ED Notes (Signed)
Patient transported to Ultrasound 

## 2021-12-22 LAB — GC/CHLAMYDIA PROBE AMP (~~LOC~~) NOT AT ARMC
Chlamydia: NEGATIVE
Comment: NEGATIVE
Comment: NORMAL
Neisseria Gonorrhea: NEGATIVE

## 2021-12-22 LAB — RPR: RPR Ser Ql: NONREACTIVE

## 2022-03-31 ENCOUNTER — Ambulatory Visit (INDEPENDENT_AMBULATORY_CARE_PROVIDER_SITE_OTHER): Payer: Medicaid Other | Admitting: Obstetrics

## 2022-03-31 ENCOUNTER — Encounter: Payer: Self-pay | Admitting: Obstetrics

## 2022-03-31 VITALS — BP 130/85 | HR 85 | Ht 69.0 in | Wt 221.0 lb

## 2022-03-31 DIAGNOSIS — R102 Pelvic and perineal pain: Secondary | ICD-10-CM | POA: Diagnosis not present

## 2022-03-31 DIAGNOSIS — N898 Other specified noninflammatory disorders of vagina: Secondary | ICD-10-CM | POA: Diagnosis not present

## 2022-03-31 DIAGNOSIS — Z113 Encounter for screening for infections with a predominantly sexual mode of transmission: Secondary | ICD-10-CM | POA: Diagnosis not present

## 2022-03-31 DIAGNOSIS — Z3201 Encounter for pregnancy test, result positive: Secondary | ICD-10-CM

## 2022-03-31 DIAGNOSIS — Z01419 Encounter for gynecological examination (general) (routine) without abnormal findings: Secondary | ICD-10-CM

## 2022-03-31 LAB — POCT URINE PREGNANCY: Preg Test, Ur: POSITIVE — AB

## 2022-03-31 MED ORDER — IBUPROFEN 800 MG PO TABS: 800.0000 mg | ORAL_TABLET | Freq: Three times a day (TID) | ORAL | 5 refills | Status: AC | PRN

## 2022-03-31 MED ORDER — METRONIDAZOLE 500 MG PO TABS: 500.0000 mg | ORAL_TABLET | Freq: Two times a day (BID) | ORAL | 2 refills | Status: AC

## 2022-03-31 NOTE — Progress Notes (Signed)
Patient presents for lower abdominal cramps and a yellowish discharge with a slight odor.   Last Pap: 04/19/20 Normal

## 2022-03-31 NOTE — Progress Notes (Addendum)
Subjective:        Shirley Ponce is a 46 y.o. female here for a routine exam.  Current complaints: Left-sided pelvic pain and vaginal discharge.  Also has irregular periods.  Personal health questionnaire:  Is patient Ashkenazi Jewish, have a family history of breast and/or ovarian cancer: yes Is there a family history of uterine cancer diagnosed at age < 8, gastrointestinal cancer, urinary tract cancer, family member who is a Field seismologist syndrome-associated carrier: no Is the patient overweight and hypertensive, family history of diabetes, personal history of gestational diabetes, preeclampsia or PCOS: no Is patient over 91, have PCOS,  family history of premature CHD under age 66, diabetes, smoke, have hypertension or peripheral artery disease:  no At any time, has a partner hit, kicked or otherwise hurt or frightened you?: no Over the past 2 weeks, have you felt down, depressed or hopeless?: no Over the past 2 weeks, have you felt little interest or pleasure in doing things?:no   Gynecologic History Patient's last menstrual period was 03/21/2022. Contraception: none Last Pap: 2021. Results were: normal Last mammogram: none. Results were: n/a  Obstetric History OB History  Gravida Para Term Preterm AB Living  '5 3 3   2 3  '$ SAB IAB Ectopic Multiple Live Births  1 1          # Outcome Date GA Lbr Len/2nd Weight Sex Delivery Anes PTL Lv  5 IAB           4 SAB           3 Term      CS-Unspec     2 Term      Vag-Spont     1 Term      Vag-Spont       Past Medical History:  Diagnosis Date   Anemia    Anxiety    Depression    Family history of breast cancer    Family history of colon cancer    Headache(784.0)    Migraine headache with aura    Obesity (BMI 35.0-39.9 without comorbidity)     Past Surgical History:  Procedure Laterality Date   CESAREAN SECTION  2010   CHOLECYSTECTOMY     DILATATION & CURETTAGE/HYSTEROSCOPY WITH MYOSURE N/A 07/24/2020   Procedure: DILATATION &  CURETTAGE/HYSTEROSCOPY WITH MYOSURE AND VAGINAL MYOMECTOMY;  Surgeon: Woodroe Mode, MD;  Location: [facility];  Service: Gynecology;  Laterality: N/A;   DILATION AND CURETTAGE OF UTERUS     WISDOM TOOTH EXTRACTION       Current Outpatient Medications:    ibuprofen (ADVIL) 800 MG tablet, Take 1 tablet (800 mg total) by mouth every 8 (eight) hours as needed., Disp: 30 tablet, Rfl: 0   ibuprofen (ADVIL) 800 MG tablet, Take 1 tablet (800 mg total) by mouth every 8 (eight) hours as needed., Disp: 30 tablet, Rfl: 5   metroNIDAZOLE (FLAGYL) 500 MG tablet, Take 1 tablet (500 mg total) by mouth 2 (two) times daily., Disp: 14 tablet, Rfl: 2   Multiple Vitamin (MULTIVITAMIN) tablet, Take 1 tablet by mouth daily., Disp: , Rfl:    SUMAtriptan (IMITREX) 100 MG tablet, Take 100 mg by mouth every 2 (two) hours as needed for migraine or headache. May repeat in 2 hours if headache persists or recurs., Disp: , Rfl:    doxycycline (VIBRAMYCIN) 100 MG capsule, Take 1 capsule (100 mg total) by mouth 2 (two) times daily., Disp: 28 capsule, Rfl: 0   hydrochlorothiazide (HYDRODIURIL)  25 MG tablet, Take by mouth. (Patient not taking: Reported on 03/31/2022), Disp: , Rfl:    metroNIDAZOLE (FLAGYL) 500 MG tablet, Take 1 tablet (500 mg total) by mouth 2 (two) times daily., Disp: 14 tablet, Rfl: 0   potassium chloride SA (KLOR-CON M) 20 MEQ tablet, Take 2 tablets (40 mEq total) by mouth daily for 4 days., Disp: 8 tablet, Rfl: 0   thiamine (VITAMIN B-1) 100 MG tablet, Take 100 mg by mouth daily., Disp: , Rfl:  Allergies  Allergen Reactions   Scopolamine Itching   Latex Hives   Mushroom Extract Complex Swelling    Social History   Tobacco Use   Smoking status: Never   Smokeless tobacco: Never  Substance Use Topics   Alcohol use: No    Family History  Problem Relation Age of Onset   Breast cancer Maternal Grandmother 74   Breast cancer Paternal Grandmother        age dx unk   Cancer Father 51        lung cancer, hx smoking   Thyroid disease Sister    Hypertension Sister    Breast cancer Mother 89       left breast, dx right breast cancer at 74   Colon cancer Mother 35   Breast cancer Other        5 aternal great aunts with breast cancer in 39's      Review of Systems  Constitutional: negative for fatigue and weight loss Respiratory: negative for cough and wheezing Cardiovascular: negative for chest pain, fatigue and palpitations Gastrointestinal: negative for abdominal pain and change in bowel habits Musculoskeletal:negative for myalgias Neurological: negative for gait problems and tremors Behavioral/Psych: negative for abusive relationship, depression Endocrine: negative for temperature intolerance    Genitourinary:positive for irregular periods, pelvic pain and vaginal discharge.  negative for genital lesions, hot flashes, sexual problems  Integument/breast: negative for breast lump, breast tenderness, nipple discharge and skin lesion(s)    Objective:       BP 130/85   Pulse 85   Ht '5\' 9"'$  (1.753 m)   Wt 221 lb (100.2 kg)   LMP 03/21/2022   BMI 32.64 kg/m  General:   Alert and no distress  Skin:   no rash or abnormalities  Lungs:   clear to auscultation bilaterally  Heart:   regular rate and rhythm, S1, S2 normal, no murmur, click, rub or gallop  Breasts:   normal without suspicious masses, skin or nipple changes or axillary nodes  Abdomen:  normal findings: no organomegaly, soft, non-tender and no hernia  Pelvis:  External genitalia: normal general appearance Urinary system: urethral meatus normal and bladder without fullness, nontender Vaginal: normal without tenderness, induration or masses Cervix: normal appearance Adnexa: normal bimanual exam Uterus: anteverted and non-tender, normal size   Lab Review Urine pregnancy test Labs reviewed yes Radiologic studies reviewed yes  I have spent a total of 30 minutes of face-to-face and non-face-to-face time,  excluding clinical staff time, reviewing notes and preparing to see patient, ordering tests and/or medications, and counseling the patient.   Assessment:    1. Encounter for routine gynecological examination with Papanicolaou smear of cervix Rx: - Cytology - PAP( [place])  2. Vaginal discharge Rx: - Cervicovaginal ancillary only( [place]) - metroNIDAZOLE (FLAGYL) 500 MG tablet; Take 1 tablet (500 mg total) by mouth 2 (two) times daily.  Dispense: 14 tablet; Refill: 2  3. Screen for STD (sexually transmitted disease) Rx: - HepB+HepC+HIV Panel - RPR  4. Pelvic pain Rx: - US PELVIC COMPLETE WITH TRANSVAGINAL; Future - ibuprofen (ADVIL) 800 MG tablet; Take 1 tablet (800 mg total) by mouth every 8 (eight) hours as needed.  Dispense: 30 tablet; Refill: 5  5. Positive pregnancy test - early pregnancy     Plan:    Education reviewed: calcium supplements, depression evaluation, low fat, low cholesterol diet, safe sex/STD prevention, self breast exams, and weight bearing exercise. Mammogram ordered. Follow up in: 1 year.   Meds ordered this encounter  Medications   metroNIDAZOLE (FLAGYL) 500 MG tablet    Sig: Take 1 tablet (500 mg total) by mouth 2 (two) times daily.    Dispense:  14 tablet    Refill:  2   ibuprofen (ADVIL) 800 MG tablet    Sig: Take 1 tablet (800 mg total) by mouth every 8 (eight) hours as needed.    Dispense:  30 tablet    Refill:  5   Orders Placed This Encounter  Procedures   US PELVIC COMPLETE WITH TRANSVAGINAL    Standing Status:   Future    Standing Expiration Date:   04/01/2023    Order Specific Question:   Reason for Exam (SYMPTOM  OR DIAGNOSIS REQUIRED)    Answer:   elvic pain    Order Specific Question:   Preferred imaging location?    Answer:   [facility]   HepB+HepC+HIV Panel   RPR     Shelly Bombard, MD 03/31/2022 3:09 PM

## 2022-04-01 LAB — HEPB+HEPC+HIV PANEL
HIV Screen 4th Generation wRfx: NONREACTIVE
Hep B C IgM: NEGATIVE
Hep B Core Total Ab: NEGATIVE
Hep B E Ab: NEGATIVE
Hep B E Ag: NEGATIVE
Hep B Surface Ab, Qual: NONREACTIVE
Hep C Virus Ab: NONREACTIVE
Hepatitis B Surface Ag: NEGATIVE

## 2022-04-01 LAB — CERVICOVAGINAL ANCILLARY ONLY
Bacterial Vaginitis (gardnerella): NEGATIVE
Candida Glabrata: NEGATIVE
Candida Vaginitis: NEGATIVE
Chlamydia: NEGATIVE
Comment: NEGATIVE
Comment: NEGATIVE
Comment: NEGATIVE
Comment: NEGATIVE
Comment: NEGATIVE
Comment: NORMAL
Neisseria Gonorrhea: NEGATIVE
Trichomonas: POSITIVE — AB

## 2022-04-01 LAB — RPR: RPR Ser Ql: NONREACTIVE

## 2022-04-01 NOTE — Addendum Note (Signed)
Addended by: Lucianne Lei on: 04/01/2022 10:04 AM   Modules accepted: Orders

## 2022-04-02 ENCOUNTER — Other Ambulatory Visit (HOSPITAL_COMMUNITY)
Admission: RE | Admit: 2022-04-02 | Discharge: 2022-04-02 | Disposition: A | Discharge status: Home / Self Care | Payer: Medicaid Other | Source: Ambulatory Visit | Attending: Obstetrics | Admitting: Obstetrics

## 2022-04-02 ENCOUNTER — Ambulatory Visit (INDEPENDENT_AMBULATORY_CARE_PROVIDER_SITE_OTHER): Payer: Medicaid Other

## 2022-04-02 ENCOUNTER — Other Ambulatory Visit: Payer: Self-pay | Admitting: Obstetrics

## 2022-04-02 DIAGNOSIS — R102 Pelvic and perineal pain: Secondary | ICD-10-CM

## 2022-04-02 DIAGNOSIS — N898 Other specified noninflammatory disorders of vagina: Secondary | ICD-10-CM

## 2022-04-02 DIAGNOSIS — Z01419 Encounter for gynecological examination (general) (routine) without abnormal findings: Secondary | ICD-10-CM | POA: Insufficient documentation

## 2022-04-02 DIAGNOSIS — Z3201 Encounter for pregnancy test, result positive: Secondary | ICD-10-CM | POA: Diagnosis not present

## 2022-04-02 LAB — CYTOLOGY - PAP
Comment: NEGATIVE
Diagnosis: NEGATIVE
Diagnosis: REACTIVE
High risk HPV: NEGATIVE

## 2022-04-02 LAB — SPECIMEN STATUS REPORT

## 2022-04-02 LAB — BETA HCG QUANT (REF LAB): hCG Quant: <1 m[IU]/mL

## 2022-04-02 LAB — POCT URINE PREGNANCY: Preg Test, Ur: NEGATIVE

## 2022-04-02 NOTE — Progress Notes (Signed)
Patient presents for a repeat upt, hcg, and swab. There was a discrepancy in 3 positive upt and negative quant. Patient verified urine specimen at visit on 10/24 and on today. UPT today negative x 2.  Patient informed that blood work will take about 24-48 hours to result.

## 2022-04-03 LAB — CERVICOVAGINAL ANCILLARY ONLY
Bacterial Vaginitis (gardnerella): POSITIVE — AB
Candida Glabrata: NEGATIVE
Candida Vaginitis: NEGATIVE
Chlamydia: NEGATIVE
Comment: NEGATIVE
Comment: NEGATIVE
Comment: NEGATIVE
Comment: NEGATIVE
Comment: NEGATIVE
Comment: NORMAL
Neisseria Gonorrhea: NEGATIVE
Trichomonas: POSITIVE — AB

## 2022-04-03 LAB — BETA HCG QUANT (REF LAB): hCG Quant: <1 m[IU]/mL

## 2022-04-07 ENCOUNTER — Other Ambulatory Visit: Payer: Self-pay | Admitting: Obstetrics

## 2022-04-07 NOTE — Progress Notes (Signed)
TC to pt to notify of trich and BV and RX Flagyl. Pt reports already notified by Elyn Peers, RN and has picked up RX.

## 2022-04-08 ENCOUNTER — Ambulatory Visit: Admission: RE | Admit: 2022-04-08 | Payer: Medicaid Other | Source: Ambulatory Visit

## 2022-04-17 IMAGING — CT CT ABD-PELV W/ CM
2 of 5 series · 16 of 46 positions shown, 18 images · IV contrast (Omnipaque)
Comparison: Same day pelvic ultrasound.

CLINICAL DATA: Acute nonlocalized abdominal pain.

EXAM:
CT ABDOMEN AND PELVIS WITH CONTRAST
TECHNIQUE: Multidetector CT imaging of the abdomen and pelvis was performed
using the standard protocol following bolus administration of
intravenous contrast.

[Series 2: axial st · axial · 0.92mm/px · z∈[+702,+1122]mm · 13 of 94 slices shown, 15 images]
[im 5/94  soft-tissue]
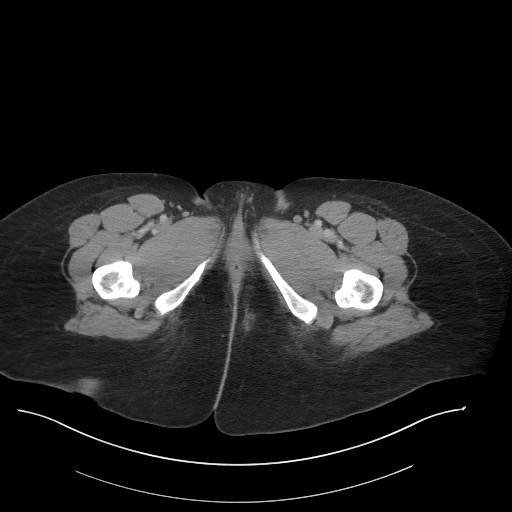
[im 5/94  bone]
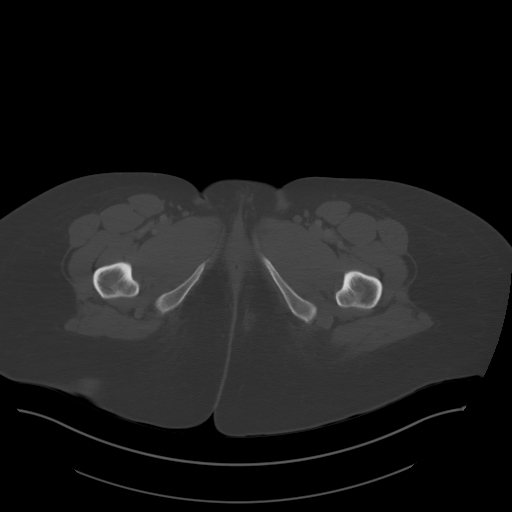
[im 15/94  soft-tissue]
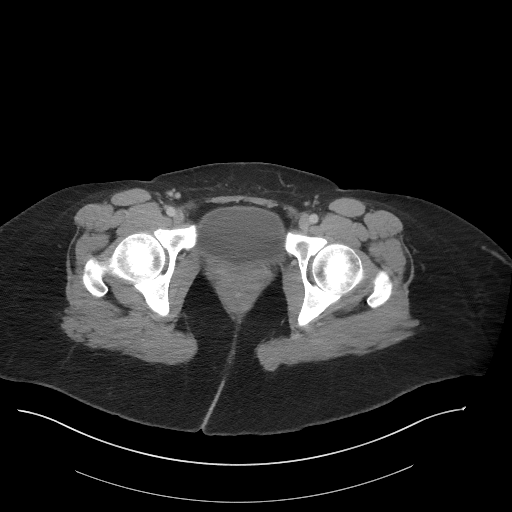
[im 20/94  soft-tissue]
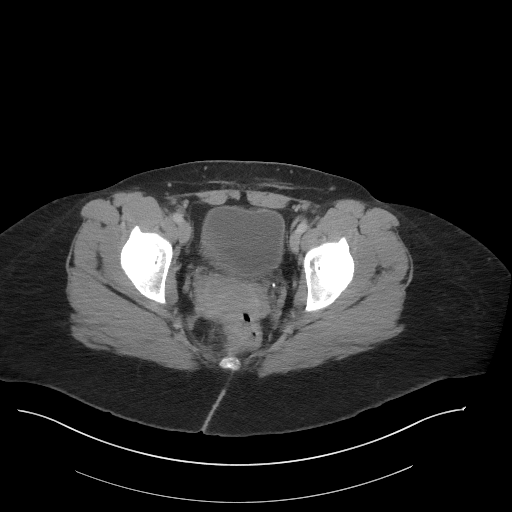
[im 25/94  soft-tissue]
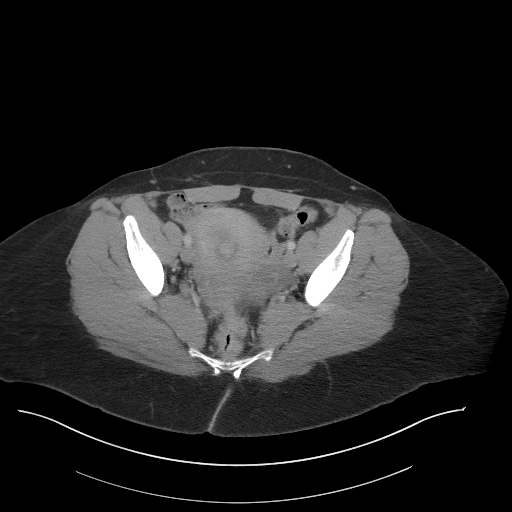
[im 35/94  soft-tissue]
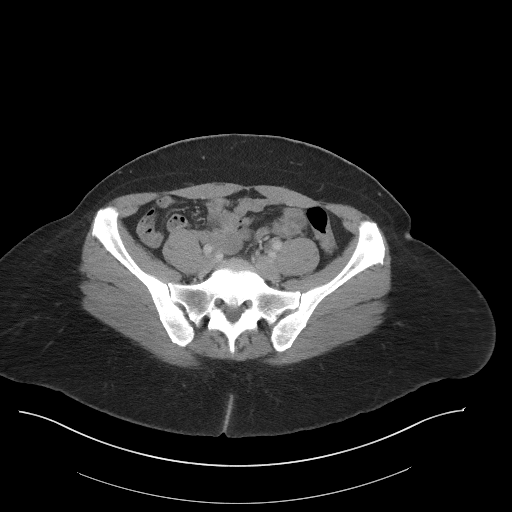
[im 40/94  soft-tissue]
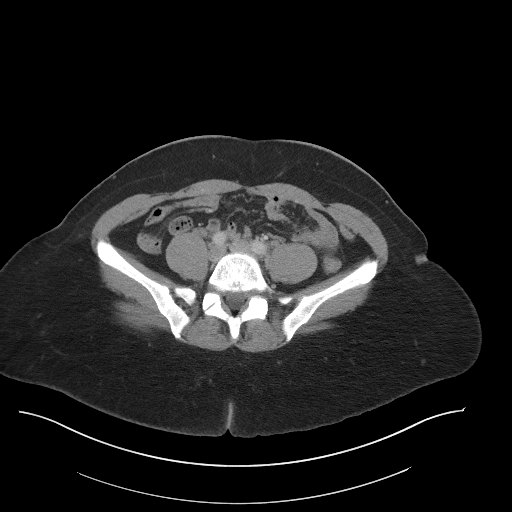
[im 49/94  soft-tissue]
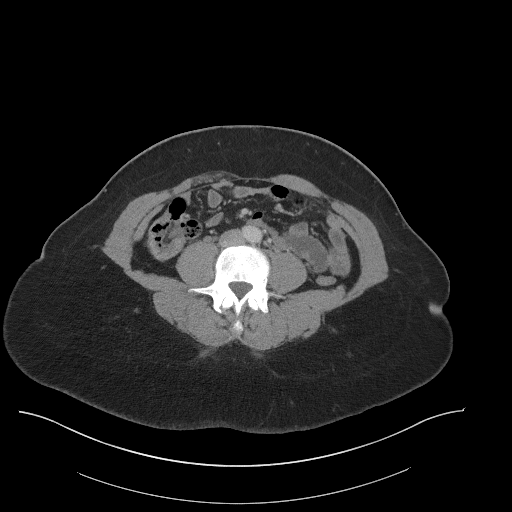
[im 54/94  soft-tissue]
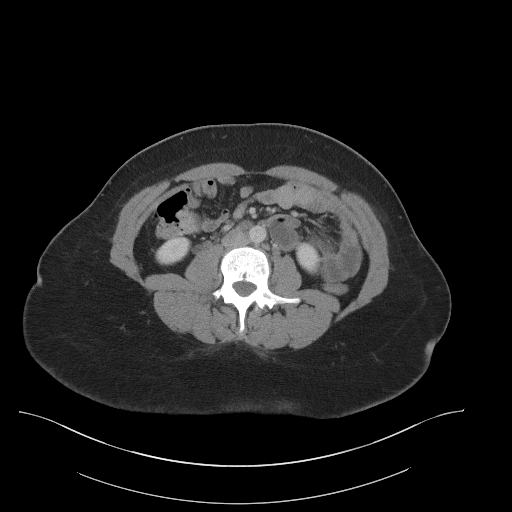
[im 59/94  soft-tissue]
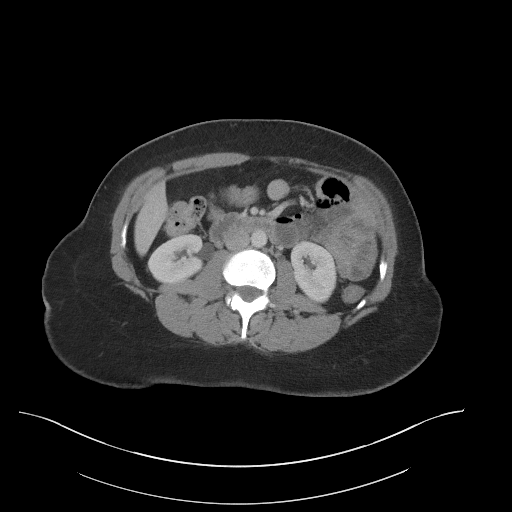
[im 59/94  bone]
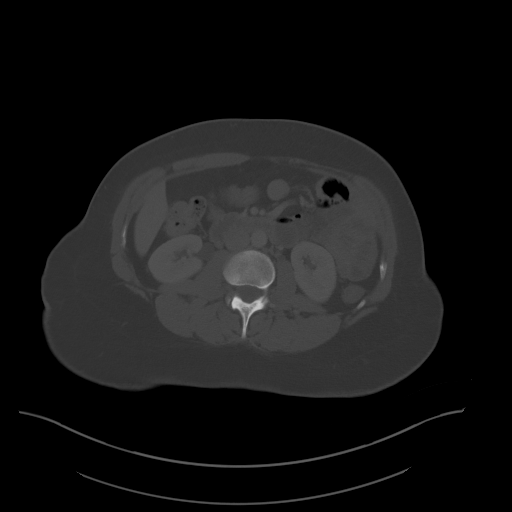
[im 69/94  soft-tissue]
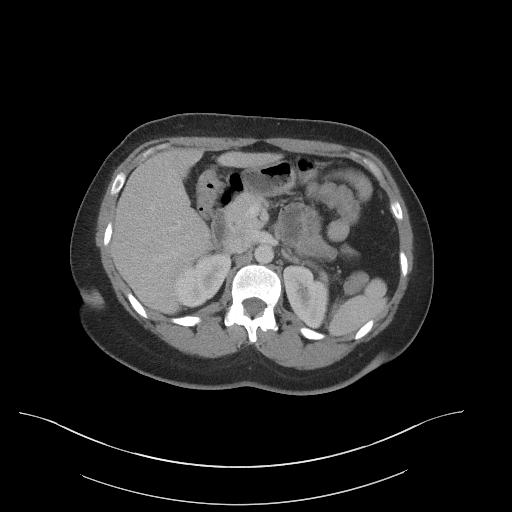
[im 74/94  soft-tissue]
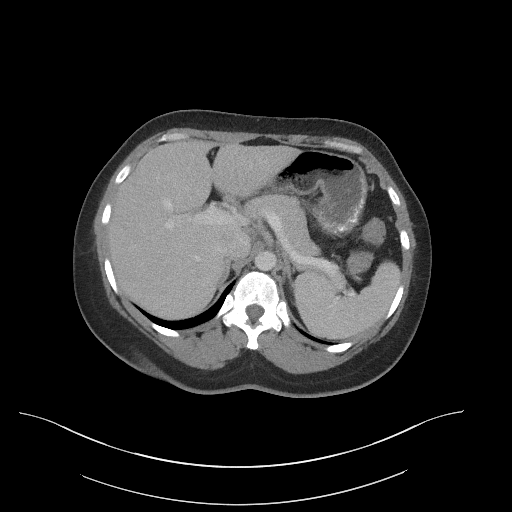
[im 79/94  soft-tissue]
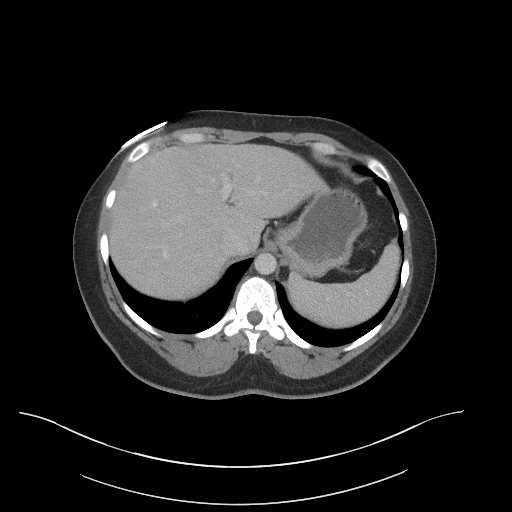
[im 89/94  soft-tissue]
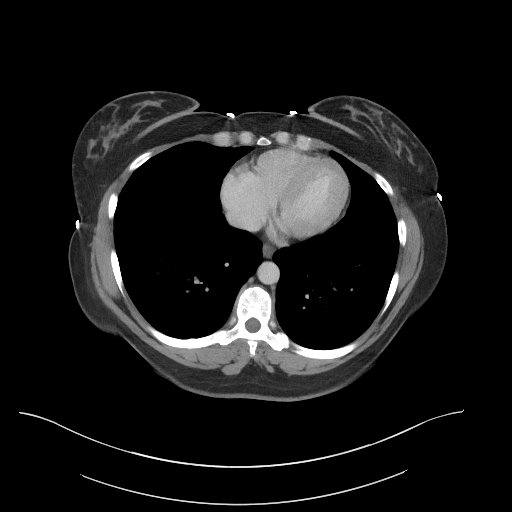

[Series 5: coronal st · coronal · 0.81mm/px · 3 of 111 slices shown]
[im 37/111  soft-tissue]
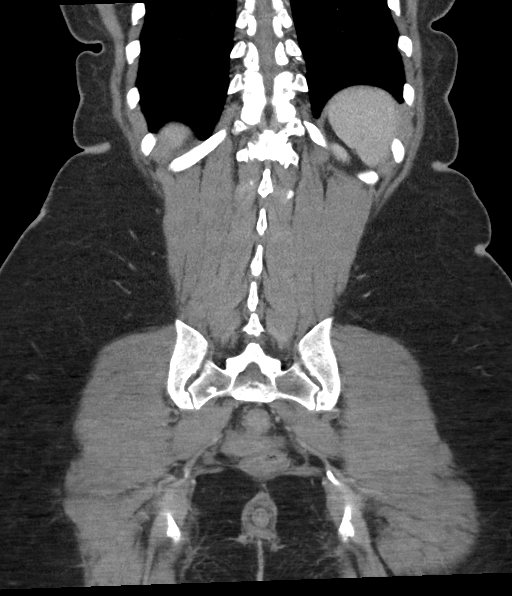
[im 49/111  soft-tissue]
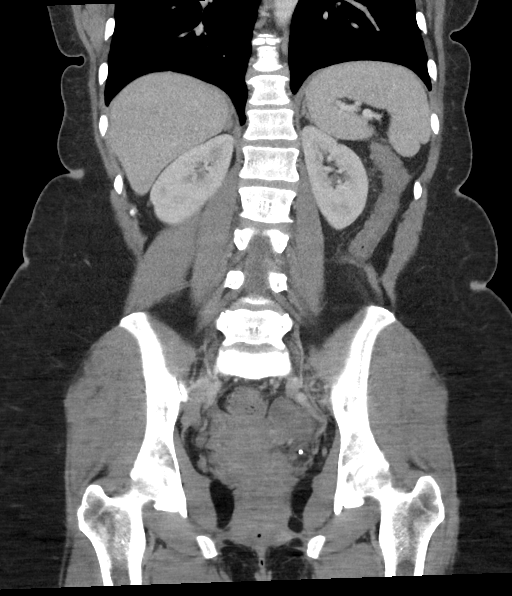
[im 62/111  soft-tissue]
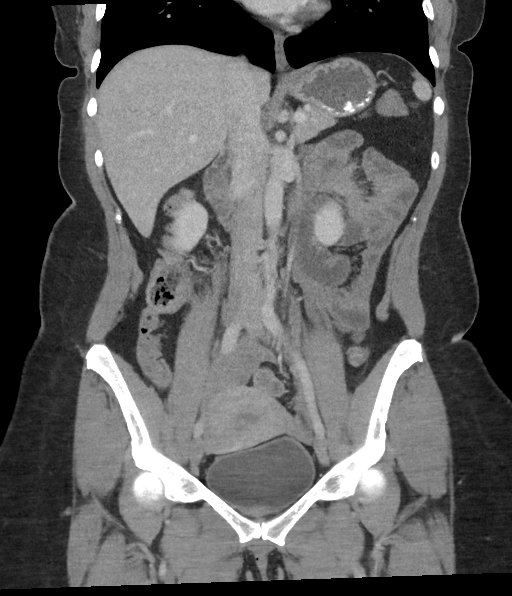

[16 of 46 positions shown; findings below may reference images not displayed]

RADIATION DOSE REDUCTION: This exam was performed according to the
departmental dose-optimization program which includes automated
exposure control, adjustment of the mA and/or kV according to
patient size and/or use of iterative reconstruction technique.

CONTRAST:  100mL OMNIPAQUE IOHEXOL 300 MG/ML  SOLN
FINDINGS: Lower chest: No acute abnormality.

Hepatobiliary: No suspicious hepatic lesion. Gallbladder surgically
absent. No biliary ductal dilation.

Pancreas: No pancreatic ductal dilation or evidence of acute
inflammation.

Spleen: No splenomegaly or focal splenic lesion.

Adrenals/Urinary Tract: Bilateral adrenal glands appear normal. No
hydronephrosis. Symmetric bilateral renal enhancement and excretion
of contrast. No solid enhancing renal mass. Urinary bladder is
unremarkable for degree of distension.

Stomach/Bowel: No enteric contrast was administered. Stomach is
unremarkable for degree of distension. No pathologic dilation of
small or large bowel.

Vascular/Lymphatic: Normal caliber abdominal aorta. No
pathologically enlarged abdominal lymph nodes.

Reproductive: Hyperenhancing 13 mm soft tissue nodularity in the
uterus likely corresponds with the submucosal fibroid seen on same
day pelvic ultrasound. 2 cm left ovarian cyst corresponds with the
probable hemorrhagic cyst seen on same day ultrasound. Right adnexa
is unremarkable.

Other: Trace pelvic free fluid may be physiologic or reflect recent
ovarian cyst rupture.

Musculoskeletal: No acute or significant osseous findings.
IMPRESSION: 1. Trace pelvic free fluid may be physiologic or reflect recent
ovarian cyst rupture.
2. Hemorrhagic 2.0 cm Left ovarian cyst evaluated on same day pelvic
ultrasound.
3. Fibroid uterus.

## 2022-04-17 IMAGING — US US PELVIS COMPLETE TRANSABD/TRANSVAG W DUPLEX AND/OR DOPPLER
1 series · 13 of 25 positions shown · non-contrast
Comparison: Ultrasound 04/27/2021

CLINICAL DATA: Left adnexal tenderness

EXAM:
TRANSABDOMINAL AND TRANSVAGINAL ULTRASOUND OF PELVIS
DOPPLER ULTRASOUND OF OVARIES
TECHNIQUE: Both transabdominal and transvaginal ultrasound examinations of the
pelvis were performed. Transabdominal technique was performed for
global imaging of the pelvis including uterus, ovaries, adnexal
regions, and pelvic cul-de-sac.
It was necessary to proceed with endovaginal exam following the
transabdominal exam to visualize the uterus/endometrium and
ovaries/adnexa. Color and duplex Doppler ultrasound was utilized to
evaluate blood flow to the ovaries.

[Series 1: us pelvis complete transabd/transvag w duplex and/ · 159 acquisitions, 13 frames shown]
[im 1/159]
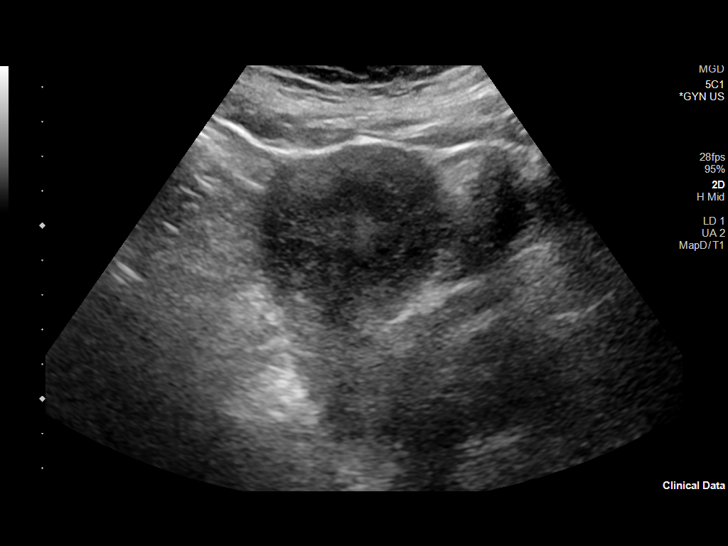
[im 14/159]
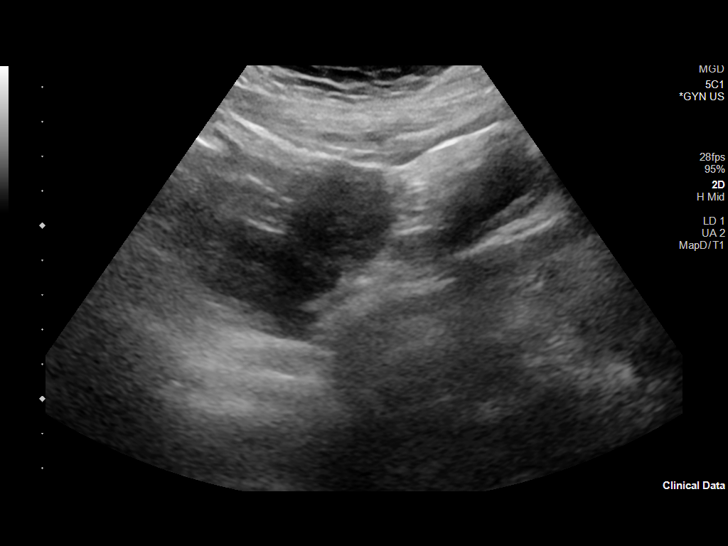
[im 27/159]
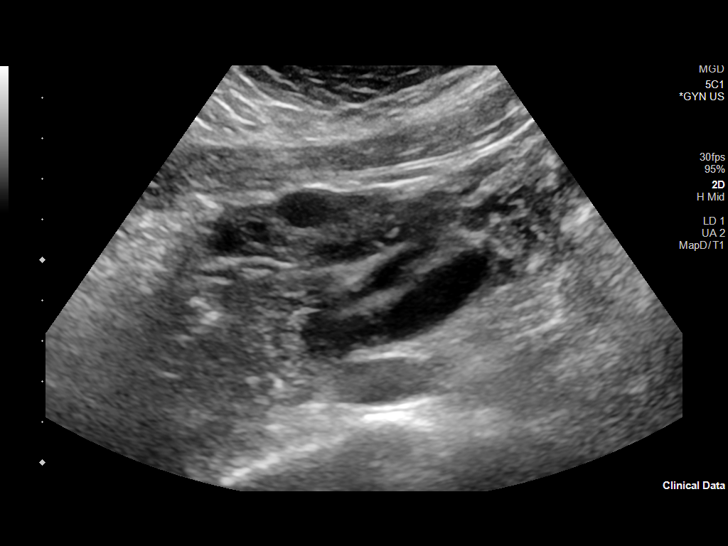
[im 40/159]
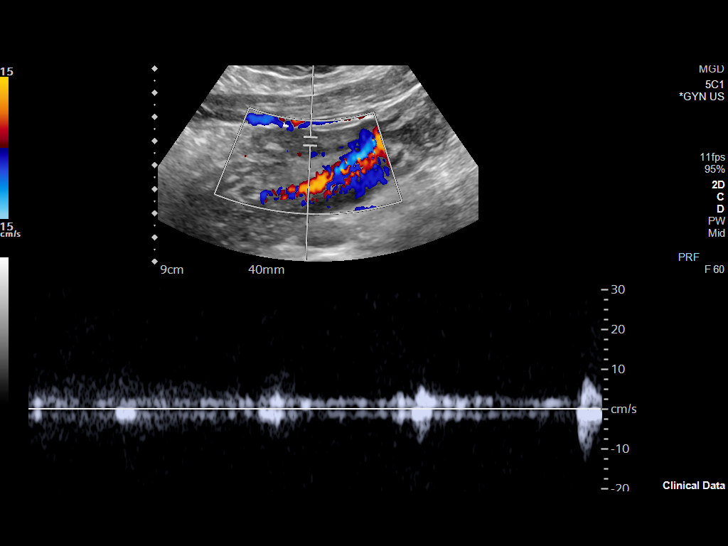
[im 53/159]
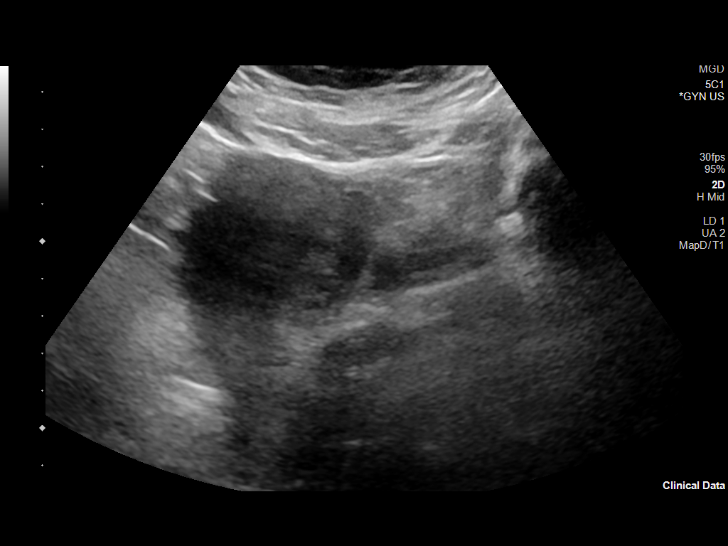
[im 66/159]
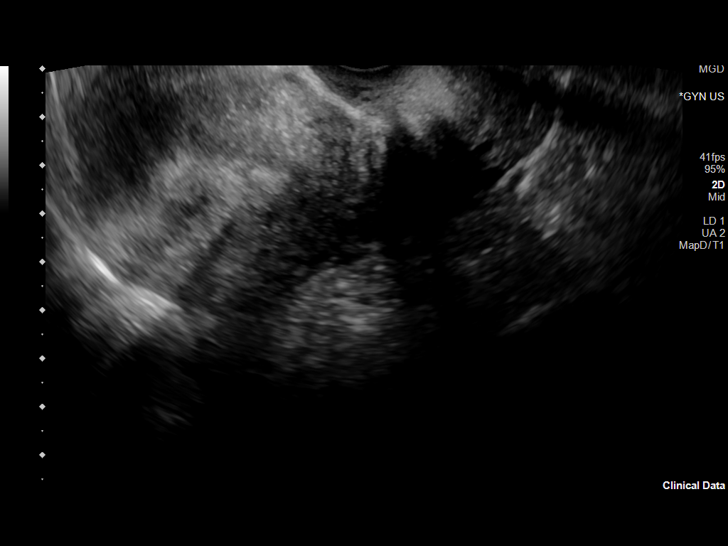
[im 80/159]
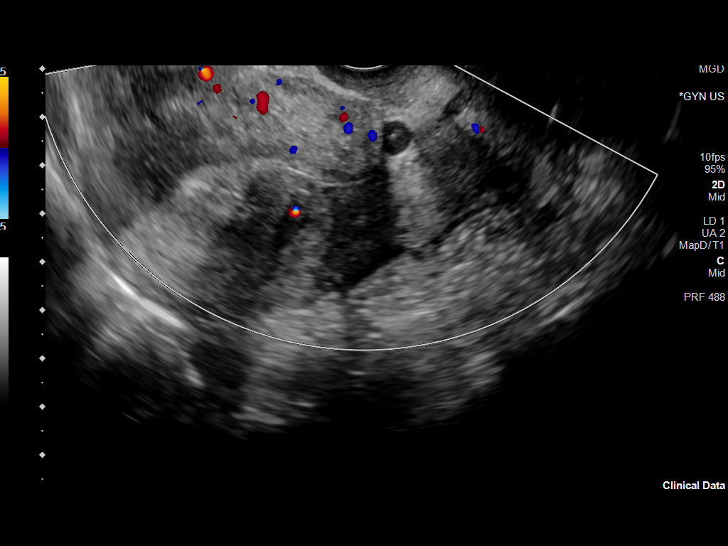
[im 93/159]
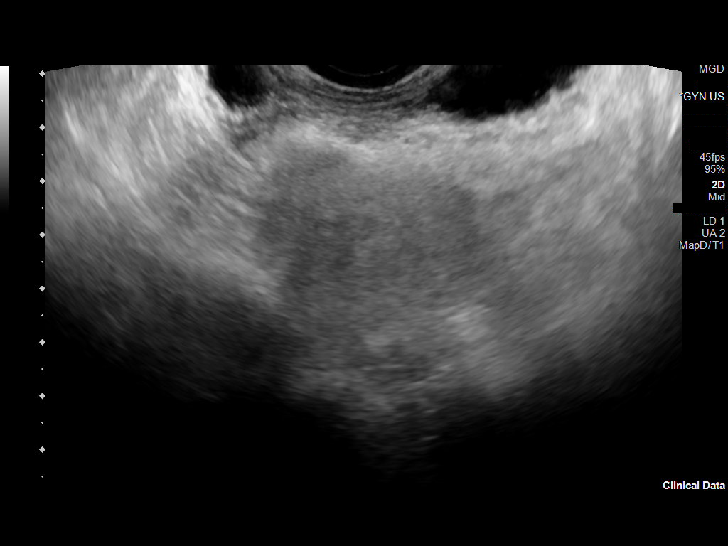
[im 106/159]
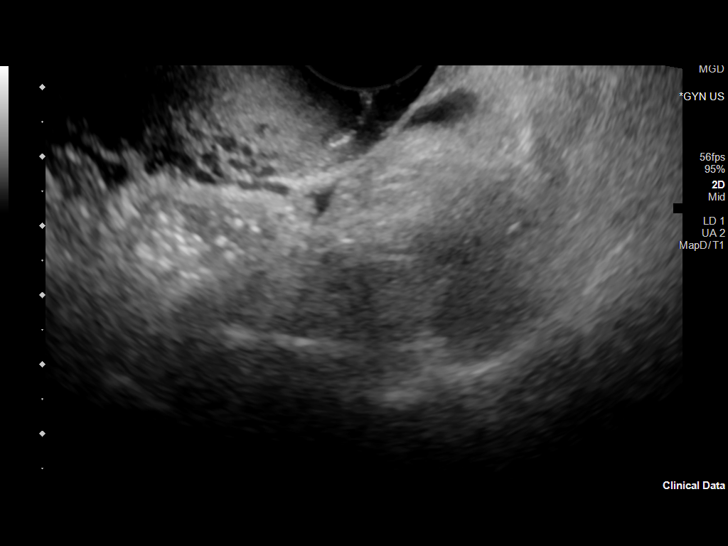
[im 119/159]
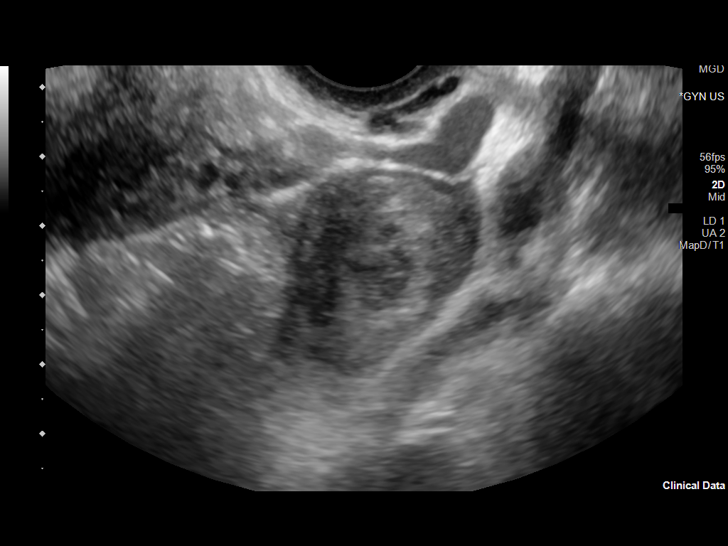
[im 132/159]
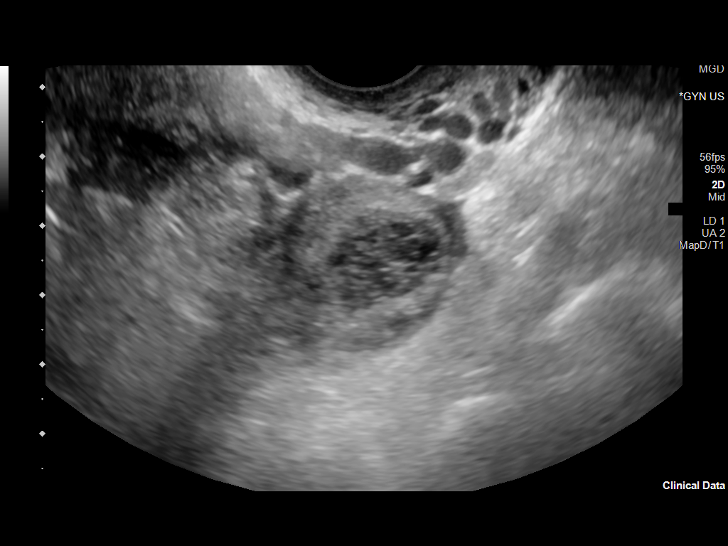
[im 145/159]
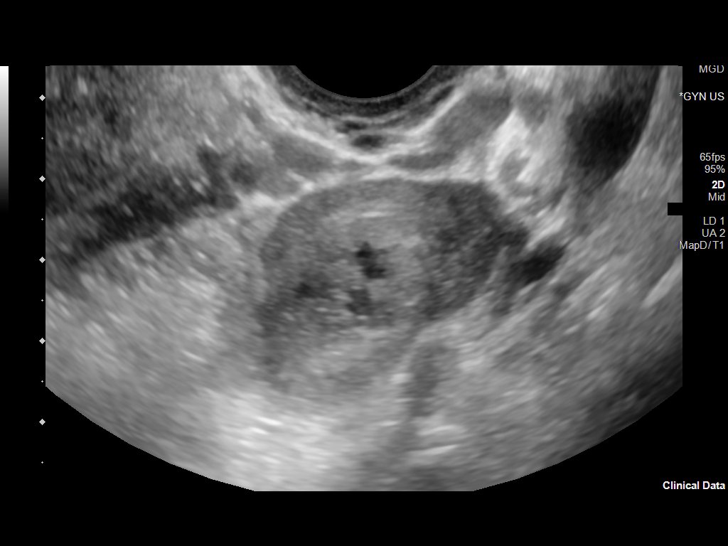
[im 159/159]
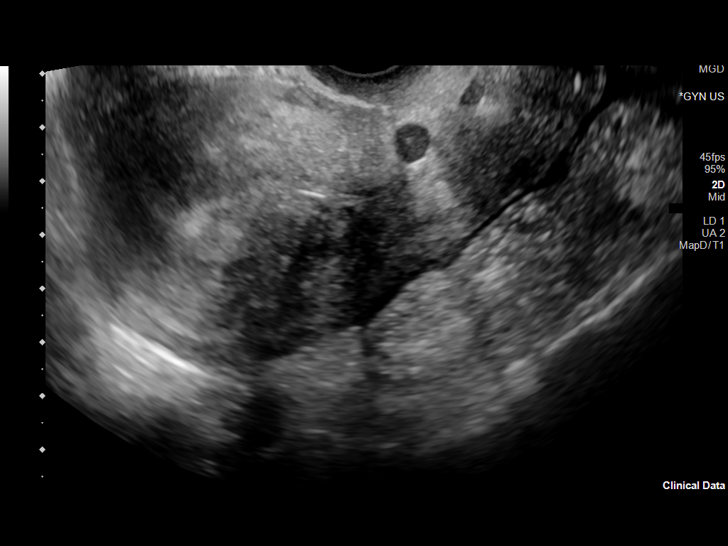

[13 of 25 positions shown; findings below may reference images not displayed]

FINDINGS: Uterus

Measurements: 8.3 x 5.8 x 6.6 cm = volume: 168.6 mL. Nabothian cysts
noted along the cervix. There is a submucosal fibroid measuring
x 1.9 x 1.4 cm.

Endometrium

Thickness: 11 mm.  No focal abnormality visualized.

Right ovary

Measurements: 5.6 x 2.0 x 3.0 cm = volume: 18.9 mL. Normal
appearance/no adnexal mass.

Left ovary

Measurements: 3.3 x 2.5 x 2.5 cm = volume: 11.6 mL. Heterogeneous
cystic area measuring 1.8 x 1.7 x 1.8 cm. There is no internal
vascularity. This is likely hemorrhagic cyst.

Pulsed Doppler evaluation demonstrates normal low-resistance
arterial and venous waveforms in both ovaries.

Other: Small volume free fluid in the pelvis.
IMPRESSION: 1.8 cm heterogeneous cystic area in the left ovary most likely
representing a hemorrhagic cyst. Small volume free fluid in the
pelvis, possibly recently ruptured.

Unremarkable right ovary.  No evidence of ovarian torsion.

1.9 cm submucosal fibroid.

## 2022-10-14 ENCOUNTER — Other Ambulatory Visit: Payer: Self-pay | Admitting: Radiology

## 2022-10-14 DIAGNOSIS — Z1239 Encounter for other screening for malignant neoplasm of breast: Secondary | ICD-10-CM

## 2023-03-02 ENCOUNTER — Ambulatory Visit
Admission: RE | Admit: 2023-03-02 | Discharge: 2023-03-02 | Disposition: A | Discharge status: Home / Self Care | Payer: Medicaid Other | Source: Ambulatory Visit | Attending: Radiology | Admitting: Radiology

## 2023-03-02 DIAGNOSIS — Z1239 Encounter for other screening for malignant neoplasm of breast: Secondary | ICD-10-CM

## 2023-12-24 ENCOUNTER — Encounter: Payer: Self-pay | Admitting: Advanced Practice Midwife

## 2024-07-12 ENCOUNTER — Ambulatory Visit: Payer: Self-pay | Admitting: Obstetrics

## 2024-08-08 ENCOUNTER — Ambulatory Visit: Admitting: Obstetrics
